# Patient Record
Sex: Male | Born: 1965
Health system: Southern US, Community
[De-identification: ages and names within clinical notes are randomized; demographics above are authoritative.]

## PROBLEM LIST (undated history)

## (undated) DIAGNOSIS — I10 Essential (primary) hypertension: Secondary | ICD-10-CM

## (undated) DIAGNOSIS — D171 Benign lipomatous neoplasm of skin and subcutaneous tissue of trunk: Secondary | ICD-10-CM

## (undated) HISTORY — PX: TONSILECTOMY, ADENOIDECTOMY, BILATERAL MYRINGOTOMY AND TUBES: SHX2538

## (undated) HISTORY — DX: Benign lipomatous neoplasm of skin and subcutaneous tissue of trunk: D17.1

## (undated) HISTORY — DX: Essential (primary) hypertension: I10

---

## 2001-02-03 ENCOUNTER — Encounter: Payer: Self-pay | Admitting: Emergency Medicine

## 2001-02-03 ENCOUNTER — Emergency Department (HOSPITAL_COMMUNITY): Admission: EM | Admit: 2001-02-03 | Discharge: 2001-02-03 | Payer: Self-pay | Admitting: Emergency Medicine

## 2003-08-06 HISTORY — PX: LAPAROSCOPIC CHOLECYSTECTOMY: SUR755

## 2004-06-06 ENCOUNTER — Ambulatory Visit (HOSPITAL_COMMUNITY): Admission: RE | Admit: 2004-06-06 | Discharge: 2004-06-07 | Payer: Self-pay

## 2004-12-28 IMAGING — RF DG CHOLANGIOGRAM OPERATIVE
1 series · 1 of 1 positions shown · non-contrast
Comparison: none

CLINICAL DATA: Biliary dyskinesia.   Laparoscopic cholecystectomy.  
 OPERATIVE CHOLANGIOGRAM:
 A single film of the operative cholangiogram shows good filling of the common bile duct and partial filling of the hepatic radicals. There is no filling defect or obstruction.  The contrast appears to flow freely into the duodenum.

[Series 1: run · 1 of 1 slices shown]
[im 1/1]
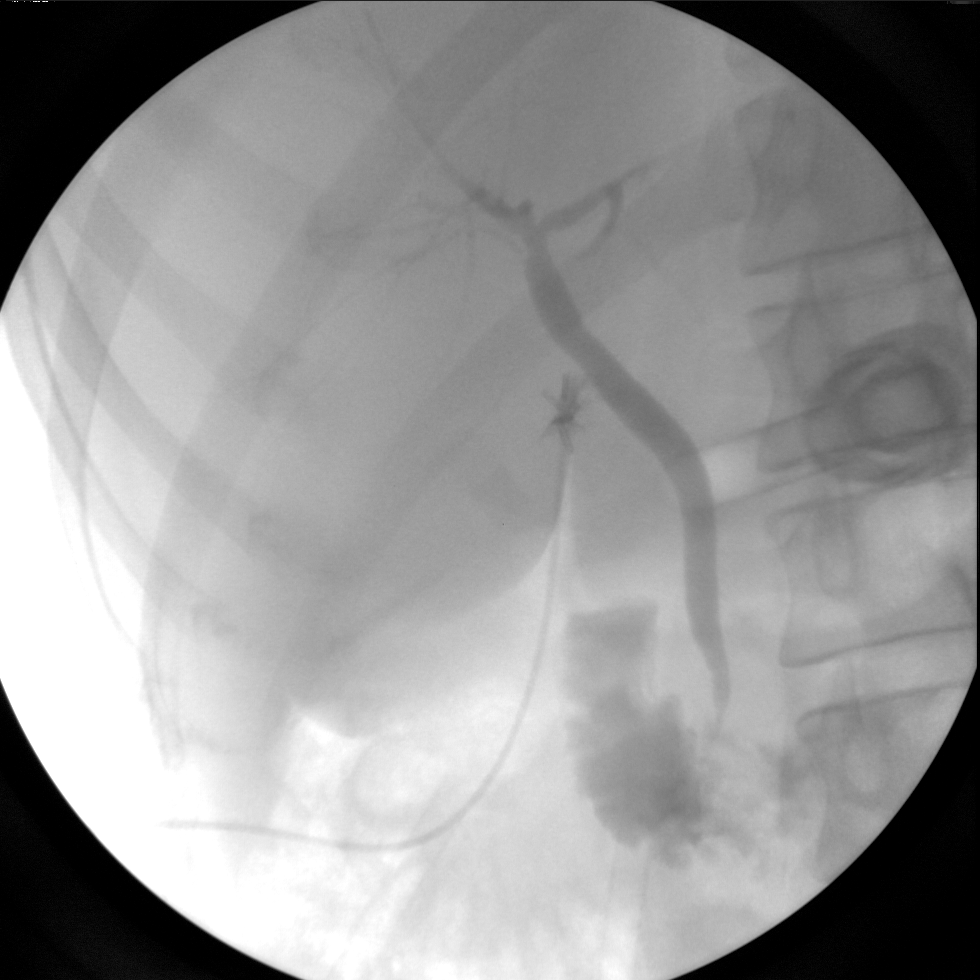

[1 of 1 positions shown; findings below may reference images not displayed]

IMPRESSION: No evidence of obstruction or filling defect of the common bile duct.

## 2006-11-21 ENCOUNTER — Emergency Department (HOSPITAL_COMMUNITY): Admission: EM | Admit: 2006-11-21 | Discharge: 2006-11-21 | Payer: Self-pay | Admitting: Family Medicine

## 2014-08-05 DIAGNOSIS — D171 Benign lipomatous neoplasm of skin and subcutaneous tissue of trunk: Secondary | ICD-10-CM

## 2014-08-05 HISTORY — DX: Benign lipomatous neoplasm of skin and subcutaneous tissue of trunk: D17.1

## 2014-08-30 ENCOUNTER — Other Ambulatory Visit: Payer: Self-pay | Admitting: Family Medicine

## 2014-08-30 ENCOUNTER — Ambulatory Visit (INDEPENDENT_AMBULATORY_CARE_PROVIDER_SITE_OTHER): Payer: BLUE CROSS/BLUE SHIELD | Admitting: Family Medicine

## 2014-08-30 ENCOUNTER — Ambulatory Visit (HOSPITAL_BASED_OUTPATIENT_CLINIC_OR_DEPARTMENT_OTHER)
Admission: RE | Admit: 2014-08-30 | Discharge: 2014-08-30 | Disposition: A | Discharge status: Home / Self Care | Payer: BLUE CROSS/BLUE SHIELD | Source: Ambulatory Visit | Attending: Family Medicine | Admitting: Family Medicine

## 2014-08-30 ENCOUNTER — Encounter: Payer: Self-pay | Admitting: Family Medicine

## 2014-08-30 VITALS — BP 151/106 | HR 84 | Temp 98.1°F | Resp 16 | Ht 64.0 in | Wt 174.0 lb

## 2014-08-30 DIAGNOSIS — R229 Localized swelling, mass and lump, unspecified: Secondary | ICD-10-CM | POA: Insufficient documentation

## 2014-08-30 DIAGNOSIS — I10 Essential (primary) hypertension: Secondary | ICD-10-CM

## 2014-08-30 NOTE — Patient Instructions (Signed)
Buy a blood pressure cuff (upper arm cuff) at any pharmacy and check your blood pressure once daily (make sure you have rested in a quiet area for 10 min prior to checking your blood pressure.  Write numbers down (blood pressure and heart rate) and bring in for review with me in 4-6 weeks.

## 2014-08-30 NOTE — Progress Notes (Signed)
Office Note 08/30/2014  CC:  Chief Complaint  Patient presents with  . Establish Care  . Mass    on back, just recently began to hurt    HPI:  Douglas Ford is a 49 y.o. Hispanic male who is here to establish care and discuss an issue with his back. Patient's most recent primary MD: [facility] [address]. Old records were not reviewed prior to or during today's visit.  Has had a swollen area on low back region, gradually has been bothering him more lately, some pain.  No drainage.  Getting bigger.    Past Medical History  Diagnosis Date  . Hypertension     Past Surgical History  Procedure Laterality Date  . Laparoscopic cholecystectomy  2005  . Tonsilectomy, adenoidectomy, bilateral myringotomy and tubes      Family History  Problem Relation Age of Onset  . Cancer Mother   . Cancer Sister     History   Social History  . Marital Status: Married    Spouse Name: N/A    Number of Children: N/A  . Years of Education: N/A   Occupational History  . Not on file.   Social History Main Topics  . Smoking status: Never Smoker   . Smokeless tobacco: Never Used  . Alcohol Use: No  . Drug Use: No  . Sexual Activity: Not on file   Other Topics Concern  . Not on file   Social History Narrative   Married, no children.   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   No T/A/Ds.    Outpatient Encounter Prescriptions as of 08/30/2014  Medication Sig  . lisinopril-hydrochlorothiazide (PRINZIDE,ZESTORETIC) 20-25 MG per tablet Take 1 tablet by mouth daily.    No Known Allergies  ROS Review of Systems  Constitutional: Negative for fever and fatigue.  HENT: Negative for congestion and sore throat.   Eyes: Negative for visual disturbance.  Respiratory: Negative for cough.   Cardiovascular: Negative for chest pain.  Gastrointestinal: Negative for nausea and abdominal pain.  Genitourinary: Negative for dysuria.  Musculoskeletal: Negative for back pain and joint  swelling.  Skin: Negative for rash.  Neurological: Negative for weakness and headaches.  Hematological: Negative for adenopathy.    PE; Blood pressure 151/106, pulse 84, temperature 98.1 F (36.7 C), temperature source Temporal, resp. rate 16, height 5\' 4"  (1.626 m), weight 174 lb (78.926 kg), SpO2 97 %. Gen: Alert, well appearing.  Patient is oriented to person, place, time, and situation. CHY:IFOY: no injection, icteris, swelling, or exudate.  EOMI, PERRLA. Mouth: lips without lesion/swelling.  Oral mucosa pink and moist. Oropharynx without erythema, exudate, or swelling.  Neck - No masses or thyromegaly or limitation in range of motion CV: RRR, no m/r/g.   LUNGS: CTA bilat, nonlabored resps, good aeration in all lung fields. EXT: no clubbing, cyanosis, or edema.  BACK: 6 cm diameter oval subQ mass that is somewhat fluctuant but firm.  No warmth, erythema, or tenderness.  No drainage tract.    Pertinent labs:  None today  ASSESSMENT AND PLAN:   New pt; obtain old records.  Subcutaneous mass Would like to get u/s and get further clarification of what this is. If lipoma or neuroma, will leave alone and monitor/manage expectantly. If cystic, then patient would like this excised/drained.   HTN (hypertension), benign Buy a blood pressure cuff (upper arm cuff) at any pharmacy and check your blood pressure once daily (make sure you have rested in a quiet area  for 10 min prior to checking your blood pressure.  Write numbers down (blood pressure and heart rate) and bring in for review with me in 4-6 weeks.     An After Visit Summary was printed and given to the patient.  Return for 4-6 wk f/u HTN.

## 2014-08-30 NOTE — Progress Notes (Signed)
Pre visit review using our clinic review tool, if applicable. No additional management support is needed unless otherwise documented below in the visit note. 

## 2014-08-30 NOTE — Assessment & Plan Note (Signed)
Would like to get u/s and get further clarification of what this is. If lipoma or neuroma, will leave alone and monitor/manage expectantly. If cystic, then patient would like this excised/drained.

## 2014-08-30 NOTE — Assessment & Plan Note (Signed)
Buy a blood pressure cuff (upper arm cuff) at any pharmacy and check your blood pressure once daily (make sure you have rested in a quiet area for 10 min prior to checking your blood pressure.  Write numbers down (blood pressure and heart rate) and bring in for review with me in 4-6 weeks.

## 2014-08-31 ENCOUNTER — Encounter: Payer: Self-pay | Admitting: Family Medicine

## 2014-10-05 ENCOUNTER — Encounter: Payer: Self-pay | Admitting: Family Medicine

## 2014-10-05 ENCOUNTER — Ambulatory Visit (INDEPENDENT_AMBULATORY_CARE_PROVIDER_SITE_OTHER): Payer: BLUE CROSS/BLUE SHIELD | Admitting: Family Medicine

## 2014-10-05 VITALS — BP 152/98 | HR 71 | Temp 97.6°F | Ht 64.0 in | Wt 177.0 lb

## 2014-10-05 DIAGNOSIS — I10 Essential (primary) hypertension: Secondary | ICD-10-CM

## 2014-10-05 LAB — COMPREHENSIVE METABOLIC PANEL
ALBUMIN: 4 g/dL (ref 3.5–5.2)
ALK PHOS: 43 U/L (ref 39–117)
ALT: 36 U/L (ref 0–53)
AST: 19 U/L (ref 0–37)
BUN: 19 mg/dL (ref 6–23)
CALCIUM: 8.7 mg/dL (ref 8.4–10.5)
CHLORIDE: 107 meq/L (ref 96–112)
CO2: 28 mEq/L (ref 19–32)
CREATININE: 0.9 mg/dL (ref 0.40–1.50)
GFR: 95.22 mL/min (ref >60.00)
Glucose, Bld: 107 mg/dL — ABNORMAL HIGH (ref 70–99)
Potassium: 3.7 mEq/L (ref 3.5–5.1)
Sodium: 141 mEq/L (ref 135–145)
Total Bilirubin: 0.6 mg/dL (ref 0.2–1.2)
Total Protein: 6.5 g/dL (ref 6.0–8.3)

## 2014-10-05 NOTE — Patient Instructions (Signed)
Check bp and heart rate daily for 2 weeks and call with report of these numbers to my nurse.  Review DASH diet handout.

## 2014-10-05 NOTE — Progress Notes (Signed)
OFFICE NOTE  10/05/2014  CC:  Chief Complaint  Patient presents with  . Follow-up   HPI: Patient is a 49 y.o. Hispanic male who is here for f/u HTN.   Bought bp cuff but didn't get batteries so no checks yet. No persistent HA's, no dizziness, no vision probs. Compliant with bp med but not low sod diet.  Pertinent PMH:  Past medical, surgical, social, and family history reviewed and no changes are noted since last office visit.  MEDS:  Outpatient Prescriptions Prior to Visit  Medication Sig Dispense Refill  . lisinopril-hydrochlorothiazide (PRINZIDE,ZESTORETIC) 20-25 MG per tablet Take 1 tablet by mouth daily.     No facility-administered medications prior to visit.    PE: Blood pressure 152/98, pulse 71, temperature 97.6 F (36.4 C), temperature source Temporal, height 5\' 4"  (1.626 m), weight 177 lb (80.287 kg), SpO2 97 %. Gen: Alert, well appearing.  Patient is oriented to person, place, time, and situation. AFFECT: pleasant, lucid thought and speech. No further exam today.  IMPRESSION AND PLAN:  1) HTN; elevated here this visit and last visit but we need home bp monitoring date. Instructions: Check bp and heart rate daily for 2 weeks and call with report of these numbers to my nurse. CMET today (he is not fasting). Review DASH diet handout.   FOLLOW UP: To be determined based on results of pending bp values.

## 2014-10-05 NOTE — Progress Notes (Signed)
Pre visit review using our clinic review tool, if applicable. No additional management support is needed unless otherwise documented below in the visit note. 

## 2015-09-04 ENCOUNTER — Telehealth: Payer: Self-pay | Admitting: Family Medicine

## 2015-09-04 MED ORDER — LISINOPRIL-HYDROCHLOROTHIAZIDE 20-25 MG PO TABS: 1.0000 | ORAL_TABLET | Freq: Every day | ORAL | Status: DC

## 2015-09-04 NOTE — Telephone Encounter (Signed)
RF request for lisinopril/hctz LOV: 10/05/14 Next ov: None Last written: unknown  Rx sent for #30 w/ 3Rf. Pt needs ov for more refills.

## 2015-09-04 NOTE — Telephone Encounter (Signed)
Left message for Douglas Ford to call back.  

## 2015-09-04 NOTE — Telephone Encounter (Signed)
Douglas Ford and voiced understanding, okay per DPR.

## 2015-09-04 NOTE — Telephone Encounter (Signed)
Lisinopril [facility]

## 2015-09-21 ENCOUNTER — Encounter: Payer: Self-pay | Admitting: Family Medicine

## 2015-09-21 ENCOUNTER — Ambulatory Visit (INDEPENDENT_AMBULATORY_CARE_PROVIDER_SITE_OTHER): Payer: 59 | Admitting: Family Medicine

## 2015-09-21 VITALS — BP 124/84 | HR 68 | Temp 97.7°F | Resp 16 | Ht 64.0 in | Wt 180.0 lb

## 2015-09-21 DIAGNOSIS — K219 Gastro-esophageal reflux disease without esophagitis: Secondary | ICD-10-CM

## 2015-09-21 DIAGNOSIS — D179 Benign lipomatous neoplasm, unspecified: Secondary | ICD-10-CM | POA: Diagnosis not present

## 2015-09-21 DIAGNOSIS — I10 Essential (primary) hypertension: Secondary | ICD-10-CM

## 2015-09-21 DIAGNOSIS — F4541 Pain disorder exclusively related to psychological factors: Secondary | ICD-10-CM | POA: Diagnosis not present

## 2015-09-21 LAB — BASIC METABOLIC PANEL
BUN: 15 mg/dL (ref 6–23)
CO2: 31 mEq/L (ref 19–32)
Calcium: 9.1 mg/dL (ref 8.4–10.5)
Chloride: 104 mEq/L (ref 96–112)
Creatinine, Ser: 0.91 mg/dL (ref 0.40–1.50)
GFR: 93.65 mL/min (ref >60.00)
Glucose, Bld: 94 mg/dL (ref 70–99)
Potassium: 3.7 mEq/L (ref 3.5–5.1)
SODIUM: 141 meq/L (ref 135–145)

## 2015-09-21 MED ORDER — AMLODIPINE BESYLATE 5 MG PO TABS: 5.0000 mg | ORAL_TABLET | Freq: Every day | ORAL | Status: DC

## 2015-09-21 NOTE — Progress Notes (Signed)
OFFICE VISIT  09/21/2015   CC:  Chief Complaint  Patient presents with  . Heartburn    x 2 weeks off and on  . Hypertension     HPI:    Patient is a 50 y.o. Hispanic male who presents for f/u HTN--I last saw him about 11 mo ago.  Also wants to discuss GER sx's. Checks bp "randomly".   Avg syst per his recollection is >140 but close to normal.  Diastolic numbers not recalled as well but they think they are a bit high.  He takes his med every day.  No changes in diet or exercise since I saw him last.  Still having some HA's.  He is not able to be clear on the frequency.  Throbbing HA, gets better with advil or tylenol. Occ nausea.   He feels frequent pain in back at the site of his lipoma, feels like it is getting a bit bigger.  Last couple of weeks he feels more frequent substernal burning after eating.  Tums helps a little.    ROS: no n/v, no melena/hematochezia, no dizziness, no CP or SOB, no vision complaints, no focal weakness.  Past Medical History  Diagnosis Date  . Hypertension   . Lipoma of back 08/2014    ultrasound-confirmed    Past Surgical History  Procedure Laterality Date  . Laparoscopic cholecystectomy  2005  . Tonsilectomy, adenoidectomy, bilateral myringotomy and tubes      Outpatient Prescriptions Prior to Visit  Medication Sig Dispense Refill  . lisinopril-hydrochlorothiazide (PRINZIDE,ZESTORETIC) 20-25 MG tablet Take 1 tablet by mouth daily. 30 tablet 3   No facility-administered medications prior to visit.    No Known Allergies  ROS As per HPI  PE: Blood pressure 124/84, pulse 68, temperature 97.7 F (36.5 C), temperature source Oral, resp. rate 16, height 5\' 4"  (1.626 m), weight 180 lb (81.647 kg), SpO2 95 %. Gen: Alert, well appearing.  Patient is oriented to person, place, time, and situation. CV: RRR, no m/r/g.   LUNGS: CTA bilat, nonlabored resps, good aeration in all lung fields. BACK: R LB directly overlying paraspinous muscles is a  fairly soft subQ mass 3 inches in diamter L to R and 1.5 inches top to bottom.  This has previously been imaged and is a lipoma.  LABS:  None today  IMPRESSION AND PLAN:  1) HTN; control pretty good but not ideal.  Home monitoring needs to be better/clearer. Check blood pressure and heart rate 1-2 times per week and write the numbers down for review with me at next follow up visit in 3 months. Add amlodipine 5mg  qd. Check BMET today.  2) GERD: Buy generic otc zantac 150mg  tabs and take one as needed up to twice a day for heartburn. GERD diet handout given to patient.  3) Tension/stress headaches.  Possible occ migrainous type HA.  Not related to HTN. REassured, recommended he continue prn management with advil or tylenol since these are not that frequent and this med helps.  4) Lipoma: he insists that this is causing him pain when he works.  I told him I would refer him to gen surgeon for consideration of excision if he wants---he wants to think about it for now.  An After Visit Summary was printed and given to the patient  FOLLOW UP: Return in about 3 months (around 12/19/2015) for annual CPE (fasting).

## 2015-09-21 NOTE — Progress Notes (Signed)
Pre visit review using our clinic review tool, if applicable. No additional management support is needed unless otherwise documented below in the visit note. 

## 2015-09-21 NOTE — Patient Instructions (Addendum)
Buy generic otc zantac 150mg  tabs and take one as needed up to twice a day for heartburn.  Check blood pressure and heart rate 1-2 times per week and write the numbers down for review with me at next follow up visit in 3 months.

## 2015-09-22 ENCOUNTER — Encounter: Payer: Self-pay | Admitting: Family Medicine

## 2015-12-11 ENCOUNTER — Telehealth: Payer: Self-pay | Admitting: Family Medicine

## 2015-12-11 NOTE — Telephone Encounter (Signed)
The fatty tumor on patient's back is starting to hurt. He is wondering if he should have it removed. If yes, who does Dr. Anitra Lauth recommend?

## 2015-12-12 ENCOUNTER — Other Ambulatory Visit: Payer: Self-pay | Admitting: Family Medicine

## 2015-12-12 DIAGNOSIS — D171 Benign lipomatous neoplasm of skin and subcutaneous tissue of trunk: Secondary | ICD-10-CM

## 2015-12-12 NOTE — Telephone Encounter (Signed)
I'll make referral to [facility] in [place].  Any of the surgeons there are good.

## 2015-12-13 NOTE — Telephone Encounter (Signed)
Spoke to spouse. Gave info per Dr. Idelle Leech previous note. Spouse verbalized understanding.

## 2015-12-25 ENCOUNTER — Ambulatory Visit: Payer: Self-pay | Admitting: Surgery

## 2015-12-26 ENCOUNTER — Encounter: Payer: 59 | Admitting: Family Medicine

## 2015-12-26 DIAGNOSIS — Z0289 Encounter for other administrative examinations: Secondary | ICD-10-CM

## 2016-01-31 ENCOUNTER — Other Ambulatory Visit: Payer: Self-pay | Admitting: Family Medicine

## 2016-02-01 ENCOUNTER — Other Ambulatory Visit: Payer: Self-pay | Admitting: *Deleted

## 2016-02-01 NOTE — Telephone Encounter (Signed)
RF request for lisinopril/hctz LOV: 09/21/15 Next ov: None Last written: 09/04/15 #30 w/ 3RF

## 2016-04-10 ENCOUNTER — Other Ambulatory Visit: Payer: Self-pay | Admitting: Family Medicine

## 2016-06-17 ENCOUNTER — Other Ambulatory Visit: Payer: Self-pay | Admitting: Family Medicine

## 2016-06-18 NOTE — Telephone Encounter (Signed)
Tried to contact patient,  Pt missed his last CPE.   He needs an appointment for further refills.

## 2016-07-10 ENCOUNTER — Other Ambulatory Visit: Payer: Self-pay | Admitting: Family Medicine

## 2016-07-11 ENCOUNTER — Encounter: Payer: Self-pay | Admitting: *Deleted

## 2016-07-11 NOTE — Telephone Encounter (Signed)
RF request for amlodipine LOV: 09/11/15 Next ov: None Last written: 04/11/16 #30 w/ 0RF  RF request for lisinopril/hctz Last written: 02/01/16 #30 w/ 3RF  Rx's sent for #30 w/ [id number]. Pt is over due for office visit.   Tried to call pt NA and unable to leave a message. Letter sent to address in EMR.

## 2016-08-26 ENCOUNTER — Telehealth: Payer: Self-pay | Admitting: Family Medicine

## 2016-08-26 NOTE — Telephone Encounter (Signed)
Pt's spouse contacted our office stating they just moved to [place] from [facility] and they would like to establish care at [facility] with Dr. Yong Channel.  Is this okay to schedule?

## 2016-08-26 NOTE — Telephone Encounter (Signed)
OK with me.

## 2016-09-03 ENCOUNTER — Encounter: Payer: Self-pay | Admitting: Gastroenterology

## 2016-09-03 ENCOUNTER — Ambulatory Visit (INDEPENDENT_AMBULATORY_CARE_PROVIDER_SITE_OTHER): Payer: 59 | Admitting: Family Medicine

## 2016-09-03 ENCOUNTER — Encounter: Payer: Self-pay | Admitting: Family Medicine

## 2016-09-03 VITALS — BP 138/90 | HR 73 | Temp 98.4°F | Ht 64.5 in | Wt 177.4 lb

## 2016-09-03 DIAGNOSIS — Z1211 Encounter for screening for malignant neoplasm of colon: Secondary | ICD-10-CM

## 2016-09-03 DIAGNOSIS — I10 Essential (primary) hypertension: Secondary | ICD-10-CM | POA: Diagnosis not present

## 2016-09-03 DIAGNOSIS — R51 Headache: Secondary | ICD-10-CM

## 2016-09-03 DIAGNOSIS — R519 Headache, unspecified: Secondary | ICD-10-CM | POA: Insufficient documentation

## 2016-09-03 DIAGNOSIS — R229 Localized swelling, mass and lump, unspecified: Secondary | ICD-10-CM

## 2016-09-03 DIAGNOSIS — K219 Gastro-esophageal reflux disease without esophagitis: Secondary | ICD-10-CM | POA: Diagnosis not present

## 2016-09-03 DIAGNOSIS — Z23 Encounter for immunization: Secondary | ICD-10-CM

## 2016-09-03 MED ORDER — AMLODIPINE BESYLATE 5 MG PO TABS: 5.0000 mg | ORAL_TABLET | Freq: Every day | ORAL | 3 refills | Status: DC

## 2016-09-03 MED ORDER — LISINOPRIL-HYDROCHLOROTHIAZIDE 20-25 MG PO TABS: 1.0000 | ORAL_TABLET | Freq: Every day | ORAL | 3 refills | Status: DC

## 2016-09-03 MED ORDER — DICLOFENAC SODIUM 1 % TD GEL: 2.0000 g | Freq: Four times a day (QID) | TRANSDERMAL | 1 refills | Status: DC

## 2016-09-03 NOTE — Patient Instructions (Addendum)
Mychart?  Tdap today.   We will call you within a week or two about your referral to GI for colonoscopy. If you do not hear within 3 weeks, give [place] a call.   6 months for physical. Schedule labs a week before- ask them specifically for HIV test to be added on under screen for HIV.   Try voltaren gel for the pain over lipoma. If this is too expensive- could try something like aspercreme or icy hot. If it becomes a progressive issues, should return to Dr. Brantley Stage to discuss surgery again  BP Readings from Last 3 Encounters:  09/03/16 138/90  09/21/15 124/84  10/05/14 (!) 152/98  Blood pressure slightly high- 10 lbs weight loss, regular exercise can likely get this back in range without more medicine. Refilled both medicines today for blood pressure

## 2016-09-03 NOTE — Assessment & Plan Note (Signed)
S: mild poorly controlled on Lisinopril hct 20-25 mg, amlodipine 5mg . But took just 30 minutes before visit BP Readings from Last 3 Encounters:  09/03/16 138/90  09/21/15 124/84  10/05/14 (!) 152/98  A/P:Continue current meds:  We decided to focus on weight loss with goal 10 lbs off in the next 6 months then return for physical

## 2016-09-03 NOTE — Progress Notes (Signed)
Phone: [phone]  Subjective:  Patient presents today to establish care with me as their new primary care provider. Patient was formerly a patient of Dr. Anitra Lauth but recently moved to [place]. Chief complaint-noted.   See problem oriented charting ROS- some left low back pain, right knee pain at times (normal exam-? Oa). No chest pain or shortness of breath with stairs  The following were reviewed and entered/updated in epic: Past Medical History:  Diagnosis Date  . Hypertension   . Lipoma of back 08/2014   ultrasound-confirmed   Patient Active Problem List   Diagnosis Date Noted  . GERD (gastroesophageal reflux disease) 09/03/2016    Priority: Medium  . HTN (hypertension), benign 08/30/2014    Priority: Medium  . Headache 09/03/2016    Priority: Low  . Subcutaneous mass 08/30/2014    Priority: Low   Past Surgical History:  Procedure Laterality Date  . LAPAROSCOPIC CHOLECYSTECTOMY  2005  . TONSILECTOMY, ADENOIDECTOMY, BILATERAL MYRINGOTOMY AND TUBES      Family History  Problem Relation Age of Onset  . Breast cancer Mother     mets  . Other Father     poor communication  . Breast cancer Sister     Medications- reviewed and updated Current Outpatient Prescriptions  Medication Sig Dispense Refill  . amLODipine (NORVASC) 5 MG tablet Take 1 tablet (5 mg total) by mouth daily. 90 tablet 3  . lisinopril-hydrochlorothiazide (PRINZIDE,ZESTORETIC) 20-25 MG tablet Take 1 tablet by mouth daily. 90 tablet 3  . diclofenac sodium (VOLTAREN) 1 % GEL Apply 2 g topically 4 (four) times daily. 100 g 1   No current facility-administered medications for this visit.     Allergies-reviewed and updated No Known Allergies  Social History   Social History  . Marital status: Married    Spouse name: N/A  . Number of children: N/A  . Years of education: N/A   Social History Main Topics  . Smoking status: Never Smoker  . Smokeless tobacco: Never Used  . Alcohol use Yes   Comment: social  . Drug use: No  . Sexual activity: Not Asked   Other Topics Concern  . None   Social History Narrative   Married (same sex marriage) 07/17/13 , no children.      Ed: HS   Orig from [place].  Has lived in [place] since about 2000.   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       Hobbies: watching tv, resting.     Objective: BP 138/90   Pulse 73   Temp 98.4 F (36.9 C) (Oral)   Ht 5' 4.5" (1.638 m)   Wt 177 lb 6.4 oz (80.5 kg)   SpO2 97%   BMI 29.98 kg/m  Gen: NAD, resting comfortably HEENT: Mucous membranes are moist. Oropharynx normal CV: RRR no murmurs rubs or gallops Lungs: CTAB no crackles, wheeze, rhonchi Abdomen: soft/nontender/nondistended/normal bowel sounds.overweight Ext: no edema Skin: warm, dry, see lipoma notes below Neuro: grossly normal, moves all extremities, PERRLA  Assessment/Plan:  HTN (hypertension), benign S: mild poorly controlled on Lisinopril hct 20-25 mg, amlodipine 5mg . But took just 30 minutes before visit BP Readings from Last 3 Encounters:  09/03/16 138/90  09/21/15 124/84  10/05/14 (!) 152/98  A/P:Continue current meds:  We decided to focus on weight loss with goal 10 lbs off in the next 6 months then return for physical  GERD (gastroesophageal reflux disease) S: last year was taking Tums. Dr. Anitra Lauth recommended trial of zantac 150mg  BID as  was nto always controlling symptoms. Patient prefers prn tums so has stuck with tthis and helps- a few times a week A/P: discussed role of weight loss in helping with reflux.   Subcutaneous mass S: round bump on his back that is sore. When stands up straight starts to bother him.determined to be a lipoma in past. Saw surgeon and preferred not to have surgery at the time.  O: 5 x 5 cm lipoma left low back to left of spine A/P: trial voltaren gel- avoid oral nsaids if able with HTN mild poor control. Discussed if expensive could do aspercreme or icy hot. Tylenol also an option.  May end up needing surgery  6 months cpe with labs a week before and also HIV testing advised  Orders Placed This Encounter  Procedures  . Tdap vaccine greater than or equal to 7yo IM  . Ambulatory referral to Gastroenterology    Referral Priority:   Routine    Referral Type:   Consultation    Referral Reason:   Specialty Services Required    Number of Visits Requested:   1    Meds ordered this encounter  Medications  . amLODipine (NORVASC) 5 MG tablet    Sig: Take 1 tablet (5 mg total) by mouth daily.    Dispense:  90 tablet    Refill:  3  . lisinopril-hydrochlorothiazide (PRINZIDE,ZESTORETIC) 20-25 MG tablet    Sig: Take 1 tablet by mouth daily.    Dispense:  90 tablet    Refill:  3  . diclofenac sodium (VOLTAREN) 1 % GEL    Sig: Apply 2 g topically 4 (four) times daily.    Dispense:  100 g    Refill:  1   The duration of face-to-face time during this visit was greater than 30 minutes. Greater than 50% of this time was spent in counseling primarily on diet/exercise and relation to his disease processes  Return precautions advised.   Garret Reddish, MD

## 2016-09-03 NOTE — Assessment & Plan Note (Signed)
S: last year was taking Tums. Dr. Anitra Lauth recommended trial of zantac 150mg  BID as was nto always controlling symptoms. Patient prefers prn tums so has stuck with tthis and helps- a few times a week A/P: discussed role of weight loss in helping with reflux.

## 2016-09-03 NOTE — Progress Notes (Signed)
Pre visit review using our clinic review tool, if applicable. No additional management support is needed unless otherwise documented below in the visit note. 

## 2016-09-03 NOTE — Assessment & Plan Note (Signed)
S: round bump on his back that is sore. When stands up straight starts to bother him.determined to be a lipoma in past. Saw surgeon and preferred not to have surgery at the time.  O: 5 x 5 cm lipoma left low back to left of spine A/P: trial voltaren gel- avoid oral nsaids if able with HTN mild poor control. Discussed if expensive could do aspercreme or icy hot. Tylenol also an option. May end up needing surgery

## 2016-10-02 ENCOUNTER — Ambulatory Visit (AMBULATORY_SURGERY_CENTER): Payer: Self-pay | Admitting: *Deleted

## 2016-10-02 VITALS — Ht 65.0 in | Wt 176.0 lb

## 2016-10-02 DIAGNOSIS — Z1211 Encounter for screening for malignant neoplasm of colon: Secondary | ICD-10-CM

## 2016-10-02 MED ORDER — NA SULFATE-K SULFATE-MG SULF 17.5-3.13-1.6 GM/177ML PO SOLN: ORAL | 0 refills | Status: DC

## 2016-10-02 NOTE — Progress Notes (Signed)
Patient denies any allergies to eggs or soy. Patient denies any problems with anesthesia/sedation. Patient denies any oxygen use at home and does not take any diet/weight loss medications. EMMI education assisgned to patient on colonoscopy, this was explained and instructions given to patient. 

## 2016-10-04 ENCOUNTER — Encounter: Payer: Self-pay | Admitting: Gastroenterology

## 2016-10-15 ENCOUNTER — Ambulatory Visit (AMBULATORY_SURGERY_CENTER): Payer: 59 | Admitting: Gastroenterology

## 2016-10-15 ENCOUNTER — Encounter: Payer: Self-pay | Admitting: Gastroenterology

## 2016-10-15 VITALS — BP 130/89 | HR 62 | Temp 98.4°F | Resp 20 | Ht 65.0 in | Wt 176.0 lb

## 2016-10-15 DIAGNOSIS — Z1211 Encounter for screening for malignant neoplasm of colon: Secondary | ICD-10-CM

## 2016-10-15 DIAGNOSIS — Z1212 Encounter for screening for malignant neoplasm of rectum: Secondary | ICD-10-CM

## 2016-10-15 MED ORDER — SODIUM CHLORIDE 0.9 % IV SOLN: 500.0000 mL | INTRAVENOUS | Status: DC

## 2016-10-15 NOTE — Op Note (Signed)
[facility] Patient Name: Douglas Ford Procedure Date: 10/15/2016 8:56 AM MRN: [id number] Endoscopist: Mauri Pole , MD Age: 51 Referring MD:  Date of Birth: 08-Mar-1966 Gender: Male Account #: [id number] Procedure:                Colonoscopy Indications:              Screening for colorectal malignant neoplasm, This                            is the patient's first colonoscopy Medicines:                Monitored Anesthesia Care Procedure:                Pre-Anesthesia Assessment:                           - Prior to the procedure, a History and Physical                            was performed, and patient medications and                            allergies were reviewed. The patient's tolerance of                            previous anesthesia was also reviewed. The risks                            and benefits of the procedure and the sedation                            options and risks were discussed with the patient.                            All questions were answered, and informed consent                            was obtained. Prior Anticoagulants: The patient has                            taken no previous anticoagulant or antiplatelet                            agents. ASA Grade Assessment: II - A patient with                            mild systemic disease. After reviewing the risks                            and benefits, the patient was deemed in                            satisfactory condition to undergo the procedure.  After obtaining informed consent, the colonoscope                            was passed under direct vision. Throughout the                            procedure, the patient's blood pressure, pulse, and                            oxygen saturations were monitored continuously. The                            Colonoscope was introduced through the anus and                            advanced to the the  cecum, identified by                            appendiceal orifice and ileocecal valve. The                            colonoscopy was performed without difficulty. The                            patient tolerated the procedure well. The quality                            of the bowel preparation was excellent. The                            terminal ileum, ileocecal valve, appendiceal                            orifice, and rectum were photographed. Scope In: 9:18:23 AM Scope Out: 9:35:20 AM Scope Withdrawal Time: 0 hours 7 minutes 18 seconds  Total Procedure Duration: 0 hours 16 minutes 57 seconds  Findings:                 The perianal and digital rectal examinations were                            normal.                           A few small-mouthed diverticula were found in the                            sigmoid colon.                           The exam was otherwise without abnormality on                            direct and retroflexion views. Complications:            No immediate complications. Estimated Blood Loss:  Estimated blood loss: none. Impression:               - Diverticulosis in the sigmoid colon.                           - The examination was otherwise normal on direct                            and retroflexion views.                           - No specimens collected. Recommendation:           - Patient has a contact number available for                            emergencies. The signs and symptoms of potential                            delayed complications were discussed with the                            patient. Return to normal activities tomorrow.                            Written discharge instructions were provided to the                            patient.                           - Resume previous diet.                           - Continue present medications.                           - Repeat colonoscopy in 10 years for screening                             purposes. Mauri Pole, MD 10/15/2016 9:39:34 AM This report has been signed electronically.

## 2016-10-15 NOTE — Progress Notes (Signed)
Report to PACU, RN, vss, BBS= Clear.  

## 2016-10-15 NOTE — Progress Notes (Signed)
Pt. Passing gas, abdomen less distended.  States he Feels better.

## 2016-10-15 NOTE — Patient Instructions (Signed)
YOU HAD AN ENDOSCOPIC PROCEDURE TODAY AT THE [facility]:   Refer to the procedure report that was given to you for any specific questions about what was found during the examination.  If the procedure report does not answer your questions, please call your gastroenterologist to clarify.  If you requested that your care partner not be given the details of your procedure findings, then the procedure report has been included in a sealed envelope for you to review at your convenience later.  YOU SHOULD EXPECT: Some feelings of bloating in the abdomen. Passage of more gas than usual.  Walking can help get rid of the air that was put into your GI tract during the procedure and reduce the bloating. If you had a lower endoscopy (such as a colonoscopy or flexible sigmoidoscopy) you may notice spotting of blood in your stool or on the toilet paper. If you underwent a bowel prep for your procedure, you may not have a normal bowel movement for a few days.  Please Note:  You might notice some irritation and congestion in your nose or some drainage.  This is from the oxygen used during your procedure.  There is no need for concern and it should clear up in a day or so.  SYMPTOMS TO REPORT IMMEDIATELY:   Following lower endoscopy (colonoscopy or flexible sigmoidoscopy):  Excessive amounts of blood in the stool  Significant tenderness or worsening of abdominal pains  Swelling of the abdomen that is new, acute  Fever of 100F or higher   For urgent or emergent issues, a gastroenterologist can be reached at any hour by calling [phone].   DIET:  We do recommend a small meal at first, but then you may proceed to your regular diet.  Drink plenty of fluids but you should avoid alcoholic beverages for 24 hours.  ACTIVITY:  You should plan to take it easy for the rest of today and you should NOT DRIVE or use heavy machinery until tomorrow (because of the sedation medicines used during the test).     FOLLOW UP: Our staff will call the number listed on your records the next business day following your procedure to check on you and address any questions or concerns that you may have regarding the information given to you following your procedure. If we do not reach you, we will leave a message.  However, if you are feeling well and you are not experiencing any problems, there is no need to return our call.  We will assume that you have returned to your regular daily activities without incident.  If any biopsies were taken you will be contacted by phone or by letter within the next 1-3 weeks.  Please call us at [phone] if you have not heard about the biopsies in 3 weeks.    SIGNATURES/CONFIDENTIALITY: You and/or your care partner have signed paperwork which will be entered into your electronic medical record.  These signatures attest to the fact that that the information above on your After Visit Summary has been reviewed and is understood.  Full responsibility of the confidentiality of this discharge information lies with you and/or your care-partner.  Diverticulosis and high fiber diet information given.  Recall 10 years-2028 

## 2016-10-15 NOTE — Progress Notes (Signed)
Pt distended and has difficulty passing gas.  Gave Simethecone 0.58ml =40mg . For discomfort.

## 2016-10-16 ENCOUNTER — Telehealth: Payer: Self-pay | Admitting: *Deleted

## 2016-10-16 NOTE — Telephone Encounter (Signed)
  Follow up Call-  Call back number 10/15/2016  Post procedure Call Back phone  # [phone]  Permission to leave phone message Yes  Some recent data might be hidden     Patient questions:  Do you have a fever, pain , or abdominal swelling? No. Pain Score  0 *  Have you tolerated food without any problems? Yes.    Have you been able to return to your normal activities? Yes.    Do you have any questions about your discharge instructions: Diet   No. Medications  No. Follow up visit  No.  Do you have questions or concerns about your Care? No.  Actions: * If pain score is 4 or above: No action needed, pain <4.

## 2016-10-17 ENCOUNTER — Ambulatory Visit (INDEPENDENT_AMBULATORY_CARE_PROVIDER_SITE_OTHER): Payer: 59 | Admitting: Podiatry

## 2016-10-17 ENCOUNTER — Encounter: Payer: Self-pay | Admitting: Podiatry

## 2016-10-17 DIAGNOSIS — L600 Ingrowing nail: Secondary | ICD-10-CM

## 2016-10-17 NOTE — Patient Instructions (Signed)

## 2016-10-18 NOTE — Progress Notes (Signed)
Subjective:     Patient ID: Douglas Ford, male   DOB: 1966-02-21, 51 y.o.   MRN: [id number]  HPI patient presents with chronic ingrown toenail deformity of both big toes trading he tries to cut them out himself but it's becoming increasingly hard. States the entire right nail is damaged and the left the corners get sore   Review of Systems  All other systems reviewed and are negative.      Objective:   Physical Exam  Constitutional: He is oriented to person, place, and time.  Cardiovascular: Intact distal pulses.   Musculoskeletal: Normal range of motion.  Neurological: He is oriented to person, place, and time.  Skin: Skin is warm.  Nursing note and vitals reviewed.  neurovascular status intact muscle strength adequate range of motion within normal limits with patient found to have incurvated left hallux medial lateral borders and thickened damage right hallux nailbed. Patient shows multiple signs of trying to fix them himself and does have flatfoot deformity also noted     Assessment:     Chronic nail disease hallux bilateral with chronic ingrown toenail deformity and damaged right hallux    Plan:     H&P and conditions reviewed. Today I went ahead and discussed surgical intervention and patient wants surgery understanding risk and I infiltrated each big toe 60 mg like Marcaine mixture and under sterile conditions removed the right hallux nail exposing the matrix and applying phenol 5 applications 30 seconds followed by alcohol lavaged and for the left I removed the medial lateral border exposed the matrix and applied phenol 3 applications 30 seconds to each followed by alcohol lavage and sterile dressing. Gave instructions on soaks and reappoint

## 2016-12-30 ENCOUNTER — Ambulatory Visit (HOSPITAL_COMMUNITY)
Admission: EM | Admit: 2016-12-30 | Discharge: 2016-12-30 | Disposition: A | Discharge status: Home / Self Care | Payer: BLUE CROSS/BLUE SHIELD | Attending: Family Medicine | Admitting: Family Medicine

## 2016-12-30 ENCOUNTER — Encounter (HOSPITAL_COMMUNITY): Payer: Self-pay | Admitting: Emergency Medicine

## 2016-12-30 DIAGNOSIS — R300 Dysuria: Secondary | ICD-10-CM | POA: Insufficient documentation

## 2016-12-30 DIAGNOSIS — N12 Tubulo-interstitial nephritis, not specified as acute or chronic: Secondary | ICD-10-CM

## 2016-12-30 DIAGNOSIS — I10 Essential (primary) hypertension: Secondary | ICD-10-CM | POA: Insufficient documentation

## 2016-12-30 DIAGNOSIS — R350 Frequency of micturition: Secondary | ICD-10-CM | POA: Insufficient documentation

## 2016-12-30 LAB — POCT URINALYSIS DIP (DEVICE)
BILIRUBIN URINE: NEGATIVE
Glucose, UA: 100 mg/dL — AB
Ketones, ur: NEGATIVE mg/dL
NITRITE: POSITIVE — AB
Protein, ur: 30 mg/dL — AB
Specific Gravity, Urine: 1.02 (ref 1.005–1.030)
Urobilinogen, UA: 1 mg/dL (ref 0.0–1.0)
pH: 7 (ref 5.0–8.0)

## 2016-12-30 LAB — GLUCOSE, CAPILLARY: Glucose-Capillary: 145 mg/dL — ABNORMAL HIGH (ref 65–99)

## 2016-12-30 MED ORDER — CIPROFLOXACIN HCL 500 MG PO TABS: 500.0000 mg | ORAL_TABLET | Freq: Two times a day (BID) | ORAL | 0 refills | Status: AC

## 2016-12-30 NOTE — ED Provider Notes (Signed)
CSN: [id number]     Arrival date & time 12/30/16  1011 History   First MD Initiated Contact with Patient 12/30/16 1112     Chief Complaint  Patient presents with  . Urinary Frequency  . Dysuria   (Consider location/radiation/quality/duration/timing/severity/associated sxs/prior Treatment) Patient is a 51 y.o. Male, with hx of HTN, here for dysuria, urinary frequency, feeling like he needs to void but can't go accompany by fever of max 100.4 at home, nausea and bilateral flank pain. He denies vomiting.He denies penile discharge. He admits to mild bilateral testicular tenderness but no swelling or erythema. Symptoms have been present for 2 days. He denies history of UTI. He is homosexual and married but have been having other sexual encounter with other males.          Past Medical History:  Diagnosis Date  . Hypertension   . Lipoma of back 08/2014   ultrasound-confirmed   Past Surgical History:  Procedure Laterality Date  . LAPAROSCOPIC CHOLECYSTECTOMY  2005  . TONSILECTOMY, ADENOIDECTOMY, BILATERAL MYRINGOTOMY AND TUBES     Family History  Problem Relation Age of Onset  . Breast cancer Mother        mets  . Other Father        poor communication  . Breast cancer Sister   . Colon cancer Neg Hx    Social History  Substance Use Topics  . Smoking status: Never Smoker  . Smokeless tobacco: Never Used  . Alcohol use Yes     Comment: social    Review of Systems  Constitutional:       As stated in the HPI    Allergies  Patient has no known allergies.  Home Medications   Prior to Admission medications   Medication Sig Start Date End Date Taking? Authorizing Provider  lisinopril-hydrochlorothiazide (PRINZIDE,ZESTORETIC) 20-25 MG tablet Take 1 tablet by mouth daily. 09/03/16  Yes Marin Olp, MD  ciprofloxacin (CIPRO) 500 MG tablet Take 1 tablet (500 mg total) by mouth every 12 (twelve) hours. 12/30/16 01/04/17  Barry Dienes, NP   Meds Ordered and Administered this  Visit  Medications - No data to display  BP (!) 149/110 (BP Location: Left Arm)   Pulse 94   Temp 99.8 F (37.7 C) (Oral)   Resp 18   SpO2 99%  No data found.   Physical Exam  Constitutional: He is oriented to person, place, and time. He appears well-developed and well-nourished. No distress.  Neck: Normal range of motion.  Cardiovascular: Normal rate, regular rhythm and normal heart sounds.   No murmur heard. Pulmonary/Chest: Effort normal and breath sounds normal. He has no wheezes.  Abdominal: Soft. Bowel sounds are normal. There is no tenderness.  Genitourinary: Penis normal. No penile tenderness.  Genitourinary Comments: No penile discharge or rash noted. Testicles slightly sore to palpate, otherwise unremarkable.   Lymphadenopathy:    He has no cervical adenopathy.  Neurological: He is alert and oriented to person, place, and time.  Skin: Skin is warm and dry.  Nursing note and vitals reviewed.   Urgent Care Course     Procedures (including critical care time)  Labs Review Labs Reviewed  GLUCOSE, CAPILLARY - Abnormal; Notable for the following:       Result Value   Glucose-Capillary 145 (*)    All other components within normal limits  POCT URINALYSIS DIP (DEVICE) - Abnormal; Notable for the following:    Glucose, UA 100 (*)    Hgb urine dipstick TRACE (*)  Protein, ur 30 (*)    Nitrite POSITIVE (*)    Leukocytes, UA TRACE (*)    All other components within normal limits  URINE CULTURE  URINE CYTOLOGY ANCILLARY ONLY    Imaging Review No results found.  MDM   1. Pyelonephritis    1) UA has pos nitrite and leukocytes. Clinical presentation most consistent with pyelonephritis.  2) Urine culture pending.  2) GC/Chlamydia and trich pending 3) Start CIPRO 500 mg BID x 5 days. Take tylenol or ibuprofen for fever.  4) Noted glucose in urine today with a  capillary glucose of 145. This is insignificant since patient reports drinking some sugar water this  morning prior to arrival.   5) Informed to f/u with PCP for no improvement.     Barry Dienes, NP 12/30/16 1133

## 2016-12-30 NOTE — ED Notes (Signed)
Obtained "dirty" urine with patient instructions for obtaining

## 2016-12-30 NOTE — Discharge Instructions (Signed)
1) Take antibiotic as prescribed 2) We will call you with your urine culture result.

## 2016-12-30 NOTE — ED Triage Notes (Addendum)
The patient presented to the [facility] with a complaint of a fever and general body aches x 2 days and dysuria and urinary frequency.

## 2016-12-31 LAB — URINE CYTOLOGY ANCILLARY ONLY
Chlamydia: NEGATIVE
Neisseria Gonorrhea: NEGATIVE
TRICH (WINDOWPATH): NEGATIVE

## 2017-01-01 LAB — URINE CULTURE: Culture: >=100000 — AB

## 2017-10-03 ENCOUNTER — Other Ambulatory Visit: Payer: Self-pay | Admitting: Family Medicine

## 2017-10-07 ENCOUNTER — Ambulatory Visit (INDEPENDENT_AMBULATORY_CARE_PROVIDER_SITE_OTHER): Payer: BLUE CROSS/BLUE SHIELD | Admitting: Family Medicine

## 2017-10-07 ENCOUNTER — Encounter: Payer: Self-pay | Admitting: Family Medicine

## 2017-10-07 VITALS — BP 128/74 | HR 87 | Temp 98.3°F | Ht 64.5 in | Wt 184.6 lb

## 2017-10-07 DIAGNOSIS — D179 Benign lipomatous neoplasm, unspecified: Secondary | ICD-10-CM | POA: Diagnosis not present

## 2017-10-07 DIAGNOSIS — R059 Cough, unspecified: Secondary | ICD-10-CM

## 2017-10-07 DIAGNOSIS — R05 Cough: Secondary | ICD-10-CM

## 2017-10-07 DIAGNOSIS — M25561 Pain in right knee: Secondary | ICD-10-CM | POA: Diagnosis not present

## 2017-10-07 MED ORDER — AZITHROMYCIN 250 MG PO TABS: ORAL_TABLET | ORAL | 0 refills | Status: DC

## 2017-10-07 MED ORDER — IPRATROPIUM BROMIDE 0.06 % NA SOLN: 2.0000 | Freq: Four times a day (QID) | NASAL | 0 refills | Status: DC

## 2017-10-07 MED ORDER — BENZONATATE 200 MG PO CAPS: 200.0000 mg | ORAL_CAPSULE | Freq: Two times a day (BID) | ORAL | 0 refills | Status: DC | PRN

## 2017-10-07 MED ORDER — DICLOFENAC SODIUM 75 MG PO TBEC: 75.0000 mg | DELAYED_RELEASE_TABLET | Freq: Two times a day (BID) | ORAL | 0 refills | Status: DC

## 2017-10-07 NOTE — Patient Instructions (Signed)
Start the atrovent.  Start tessalon for your cough.  Start the zpack if your symptoms worsen or do not improve in a few days.  Please stay well hydrated.  Please let me know if your symptoms worsen or fail to improve.  Start the diclofenac. Use compression and ice for your knee.   Take care, Dr Jerline Pain

## 2017-10-07 NOTE — Progress Notes (Signed)
   Subjective:  Douglas Ford is a 52 y.o. male who presents today for same-day appointment with a chief complaint of knee pain.   HPI:  Knee Pain, Acute Issue Started about a year ago. Worsened over the past few days. No obvious falls, trauma, or other obvious precipitating events. Has not tried any over the counter medications.  No locking, popping, or catching.  No fevers or chills.  Symptoms worse with sitting to standing and with activity.  Cough, Acute Issue Started yesterday morning. Worse today. Associated with rhinorrhea, sore throat, and chills.  No over-the-counter treatments tried.  No known sick contacts.  Symptoms worse at night.  No other obvious alleviating or aggravating factors.  ROS: Per HPI  PMH: He reports that  has never smoked. he has never used smokeless tobacco. He reports that he drinks alcohol. He reports that he does not use drugs.  Objective:  Physical Exam: BP 128/74 (BP Location: Left Arm, Patient Position: Sitting, Cuff Size: Normal)   Pulse 87   Temp 98.3 F (36.8 C) (Oral)   Ht 5' 4.5" (1.638 m)   Wt 184 lb 9.6 oz (83.7 kg)   SpO2 95%   BMI 31.20 kg/m   Gen: NAD, resting comfortably HEENT: TMs with clear effusion bilaterally.  Oropharynx erythematous without exudate.  Maxillary sinuses decreased to transillumination bilaterally.  Nasal mucosa boggy and erythematous with thick, white nasal discharge.  No lymphadenopathy. CV: RRR with no murmurs appreciated Pulm: NWOB, CTAB with no crackles, wheezes, or rhonchi MSK: -Right knee: No deformities.  Tender to palpation along midline.  Stable to varus and valgus stress.  Anterior and posterior drawer signs negative.  Full range of motion.  Positive McMurray and Thessaly. -Back: Approximately 4 cm freely mobile mass along right lumbar spine.  Assessment/Plan:  Cough Likely secondary to viral URI. No signs of bacterial infection. Start atrovent for rhinorrhea/sinus congestion.  Start tessalon for cough.  Sent in a "pocket prescription" for azithromycin with strict instruction to not start unless symptoms worsen or fail to improve within the next several days. Recommended tylenol and/or motrin as needed for low grade fever and pain. Encouraged good oral hydration. Return precautions reviewed. Follow up as needed.   Right knee pain No red flag signs or symptoms.  Exam consistent with degenerative meniscal tear with possible underlying osteoarthritis.  We will start conservative management with compression, cold therapy, anti-inflammatories, and home exercise program.  Discussed treatment options including intra-articular steroid injection however patient deferred.  Send in diclofenac.  Return precautions reviewed.  Lipoma Exam consistent with lipoma.  Reports that they have had imaging in the past that confirmed this.  Is considering having surgery done however does not want to be referred today.  Algis Greenhouse. Jerline Pain, MD 10/07/2017 4:34 PM

## 2017-12-17 ENCOUNTER — Telehealth: Payer: Self-pay

## 2017-12-17 NOTE — Telephone Encounter (Signed)
Message from [place], Generic sent at 12/17/2017 8:55 AM EDT -----    Actually regarding Douglas Ford......    Douglas Ford will be traveling to [place] next month as part of his immigration process. We need to schedule an appointment for him to get vaccines that are required by the [facility].....Marland Kitchenthe ones we all got as kids.......Marland Kitchensee attachment.    He will need a statement showing that he has received the appropriate vaccines.    Late afternoons, 4 p.m., are usually a good time for him to get by there.    Thanks,  [employer]

## 2017-12-17 NOTE — Telephone Encounter (Signed)
thanks- where are the attachments?

## 2017-12-18 NOTE — Telephone Encounter (Signed)
noted thanks- will keep for visit tomorrow

## 2017-12-18 NOTE — Telephone Encounter (Signed)
In Todd's chart. I have printed it

## 2017-12-19 ENCOUNTER — Ambulatory Visit (INDEPENDENT_AMBULATORY_CARE_PROVIDER_SITE_OTHER): Payer: BLUE CROSS/BLUE SHIELD | Admitting: Family Medicine

## 2017-12-19 ENCOUNTER — Encounter: Payer: Self-pay | Admitting: Family Medicine

## 2017-12-19 VITALS — BP 118/88 | HR 83 | Temp 98.3°F | Ht 64.5 in | Wt 184.4 lb

## 2017-12-19 DIAGNOSIS — Z789 Other specified health status: Secondary | ICD-10-CM | POA: Diagnosis not present

## 2017-12-19 DIAGNOSIS — G44219 Episodic tension-type headache, not intractable: Secondary | ICD-10-CM

## 2017-12-19 DIAGNOSIS — K219 Gastro-esophageal reflux disease without esophagitis: Secondary | ICD-10-CM

## 2017-12-19 DIAGNOSIS — E669 Obesity, unspecified: Secondary | ICD-10-CM | POA: Diagnosis not present

## 2017-12-19 DIAGNOSIS — I10 Essential (primary) hypertension: Secondary | ICD-10-CM

## 2017-12-19 DIAGNOSIS — Z23 Encounter for immunization: Secondary | ICD-10-CM | POA: Diagnosis not present

## 2017-12-19 NOTE — Patient Instructions (Signed)
for age appropriate vaccines we came up with following plan 1. Up to date on Tdap as of 09/03/16.  2. Check for MMR immunity with titers- doesn't think he had these- would give vaccine if not immune 3. Check for varicella immunity with titers. Not sure if he had this- would give vaccine if no immunity.  4. Pneumococcal vaccines planned at age 52 5. Twinrix- start series today - repeat at 1 month and 6 months- go ahead and schedule these before you leave

## 2017-12-19 NOTE — Progress Notes (Signed)
Subjective:  Douglas Ford is a 52 y.o. year old very pleasant male patient who presents for/with See problem oriented charting ROS- No chest pain or shortness of breath. No headache or blurry vision.    Past Medical History-  Patient Active Problem List   Diagnosis Date Noted  . GERD (gastroesophageal reflux disease) 09/03/2016    Priority: Medium  . HTN (hypertension), benign 08/30/2014    Priority: Medium  . Obesity (BMI 30.0-34.9) 12/20/2017    Priority: Low  . Headache 09/03/2016    Priority: Low  . Subcutaneous mass 08/30/2014    Priority: Low    Medications- reviewed and updated Current Outpatient Medications  Medication Sig Dispense Refill  . amLODipine (NORVASC) 5 MG tablet TAKE ONE TABLET BY MOUTH DAILY. 90 tablet 0  . lisinopril-hydrochlorothiazide (PRINZIDE,ZESTORETIC) 20-25 MG tablet TAKE ONE TABLET BY MOUTH ONCE DAILY. 90 tablet 0   No current facility-administered medications for this visit.     Objective: BP 118/88 (BP Location: Left Arm, Patient Position: Sitting, Cuff Size: Large)   Pulse 83   Temp 98.3 F (36.8 C) (Oral)   Ht 5' 4.5" (1.638 m)   Wt 184 lb 6.4 oz (83.6 kg)   SpO2 94%   BMI 31.16 kg/m  Gen: NAD, resting comfortably CV: RRR no murmurs rubs or gallops Lungs: CTAB no crackles, wheeze, rhonchi Abdomen: soft/nontender/nondistended. obese Ext: no edema Skin: warm, dry Neuro: normal gait and speech  Assessment/Plan:  Measles, mumps, rubella (MMR) vaccination status unknown - Plan: Measles/Mumps/Rubella Immunity Varicella vaccination status unknown - Plan: Varicella zoster antibody, IgG Need for prophylactic vaccination and inoculation against viral hepatitis - Plan: Hepatitis A hepatitis B combined vaccine IM S: Patient is working to get his immigration process finalized- hoping to become naturalized citizen. He is from [place].   Sheet sent from his husband about potentially needed vaccines - Hep A - Hep B - Measles, mumbs, rubella -  pneumococcal (would give at age 35) -Tdap - polio (not age appropriate) - rotavirus (not age appropriate)  - Hib (not age appropriate)  - Varicella A/P:  for age appropriate vaccines we came up with following plan 1. Up to date on Tdap as of 09/03/16.  2. Check for MMR immunity with titers- doesn't think he had these- would give vaccine if not immune 3. Check for varicella immunity with titers. Not sure if he had this- would give vaccine if no immunity.  4. Pneumococcal vaccines planned at age 47 5. Twinrix- start series today - repeat at 1 month and 6 months- go ahead and schedule these before you leave  Immunization History  Administered Date(s) Administered  . Hep A / Hep B 12/19/2017  . Tdap 09/03/2016   GERD (gastroesophageal reflux disease) S: has not needed PPI or h2 blocker lately. Sparing tums A/P: continue current medications   HTN (hypertension), benign S: controlled on Lisinopril hct 20-25 mg, amlodipine 22m. Drinks fair amount of sweet tea and discussed how that can make weight loss difficult.  BP Readings from Last 3 Encounters:  12/19/17 118/88  10/07/17 128/74  12/30/16 (!) 149/110  A/P: We discussed blood pressure goal of <140/90. Continue current meds:  We also discussed role of weight loss to further help him loewr diastolic BP. He needs to schedule a physical within the next few months and we can update labs    Headache S: headaches much improved. Only with intense heat while out working A/P: discussed staying hydrated- doesn't look like he will be able to  avoid these wor situations   Obesity (BMI 30.0-34.9) S: weight trending up Wt Readings from Last 3 Encounters:  12/19/17 184 lb 6.4 oz (83.6 kg)  10/07/17 184 lb 9.6 oz (83.7 kg)  10/15/16 176 lb (79.8 kg)  A/P: Encouraged need for healthy eating, regular exercise, weight loss.  His sweet tea drinking would be a low hanging target which could produce weight loss significantly   Return precautions  advised.  Garret Reddish, MD

## 2017-12-20 DIAGNOSIS — E669 Obesity, unspecified: Secondary | ICD-10-CM | POA: Insufficient documentation

## 2017-12-20 NOTE — Assessment & Plan Note (Signed)
S: headaches much improved. Only with intense heat while out working A/P: discussed staying hydrated- doesn't look like he will be able to avoid these wor situations

## 2017-12-20 NOTE — Assessment & Plan Note (Signed)
S: controlled on Lisinopril hct 20-25 mg, amlodipine 5mg . Drinks fair amount of sweet tea and discussed how that can make weight loss difficult.  BP Readings from Last 3 Encounters:  12/19/17 118/88  10/07/17 128/74  12/30/16 (!) 149/110  A/P: We discussed blood pressure goal of <140/90. Continue current meds:  We also discussed role of weight loss to further help him loewr diastolic BP. He needs to schedule a physical within the next few months and we can update labs

## 2017-12-20 NOTE — Assessment & Plan Note (Signed)
S: has not needed PPI or h2 blocker lately. Sparing tums A/P: continue current medications

## 2017-12-20 NOTE — Assessment & Plan Note (Signed)
S: weight trending up Wt Readings from Last 3 Encounters:  12/19/17 184 lb 6.4 oz (83.6 kg)  10/07/17 184 lb 9.6 oz (83.7 kg)  10/15/16 176 lb (79.8 kg)  A/P: Encouraged need for healthy eating, regular exercise, weight loss.  His sweet tea drinking would be a low hanging target which could produce weight loss significantly

## 2017-12-22 LAB — VARICELLA ZOSTER ANTIBODY, IGG: VARICELLA IGG: 499.2 {index}

## 2017-12-22 LAB — MEASLES/MUMPS/RUBELLA IMMUNITY
Mumps IgG: 99.6 AU/mL
Rubella: <0.9 index — ABNORMAL LOW
Rubeola IgG: >300 AU/mL

## 2017-12-24 ENCOUNTER — Telehealth: Payer: Self-pay | Admitting: Family Medicine

## 2017-12-24 NOTE — Telephone Encounter (Signed)
The patient states he needs a shot but is unsure of which one. Please advise on immunizations.

## 2017-12-25 NOTE — Telephone Encounter (Signed)
I instructed him to go to the health department  To have his shots given. Do you still want him to get a shot in our office? He needs the MMR from the last phone call

## 2017-12-25 NOTE — Telephone Encounter (Signed)
We still need to give him the Twinrix shot at 1 month and 64-monthout from last visit.  I also think we should go ahead and give him the MMR vaccination.  The reason I think the health department may be helpful is just making sure/verifying that everything we have done is adequate for his immigration- I do not want there to be any barriers for him.  But as I said-we still need to provide the initial immunizations as above.  I apologize for my prior message since it seems to be confusing.  Please also apologize to patient for me.

## 2017-12-26 NOTE — Telephone Encounter (Signed)
Noted  

## 2017-12-26 NOTE — Telephone Encounter (Signed)
Pt has been scheduled for the mmr on Tues 5/28 per note. Also, anything else pt can have that day that he needs is ok.

## 2017-12-30 ENCOUNTER — Ambulatory Visit (INDEPENDENT_AMBULATORY_CARE_PROVIDER_SITE_OTHER): Payer: BLUE CROSS/BLUE SHIELD

## 2017-12-30 DIAGNOSIS — Z23 Encounter for immunization: Secondary | ICD-10-CM

## 2017-12-30 NOTE — Progress Notes (Signed)
MMR, 0.5 mL given SQ, L arm, mfg: [employer], lot#: [id number], exp: 06/06/2019, NDC: 2902-1115-52, pt tolerated well.

## 2018-01-15 ENCOUNTER — Other Ambulatory Visit: Payer: Self-pay | Admitting: Family Medicine

## 2018-11-23 ENCOUNTER — Telehealth: Payer: Self-pay | Admitting: Family Medicine

## 2018-11-23 ENCOUNTER — Encounter: Payer: Self-pay | Admitting: Family Medicine

## 2018-11-23 ENCOUNTER — Ambulatory Visit (INDEPENDENT_AMBULATORY_CARE_PROVIDER_SITE_OTHER): Payer: BLUE CROSS/BLUE SHIELD | Admitting: Family Medicine

## 2018-11-23 DIAGNOSIS — Z114 Encounter for screening for human immunodeficiency virus [HIV]: Secondary | ICD-10-CM

## 2018-11-23 DIAGNOSIS — Z113 Encounter for screening for infections with a predominantly sexual mode of transmission: Secondary | ICD-10-CM

## 2018-11-23 DIAGNOSIS — Z202 Contact with and (suspected) exposure to infections with a predominantly sexual mode of transmission: Secondary | ICD-10-CM | POA: Diagnosis not present

## 2018-11-23 DIAGNOSIS — I10 Essential (primary) hypertension: Secondary | ICD-10-CM

## 2018-11-23 DIAGNOSIS — Z1159 Encounter for screening for other viral diseases: Secondary | ICD-10-CM

## 2018-11-23 NOTE — Telephone Encounter (Signed)
Copied from [place] [id number]. Topic: General - Inquiry >> Nov 23, 2018  4:33 PM Margot Ables wrote: Reason for CRM: requesting that lab for "anti chlamydia pneumoniae igg" be added to panel for testing. Also asking if 1g of rocephin will be administered as precaution. Please advise.

## 2018-11-23 NOTE — Patient Instructions (Signed)
Health Maintenance Due  Topic Date Due  . Hepatitis C Screening  06/30/1965  . HIV Screening  07/01/1979  Will screen both with labs today  Needs updated PHQ 2 at follow-up
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
## 2018-11-24 ENCOUNTER — Other Ambulatory Visit: Payer: Self-pay

## 2018-11-24 ENCOUNTER — Other Ambulatory Visit: Payer: BLUE CROSS/BLUE SHIELD

## 2018-11-24 ENCOUNTER — Other Ambulatory Visit (HOSPITAL_COMMUNITY)
Admission: RE | Admit: 2018-11-24 | Discharge: 2018-11-24 | Disposition: A | Discharge status: Home / Self Care | Payer: BLUE CROSS/BLUE SHIELD | Source: Ambulatory Visit | Attending: Family Medicine | Admitting: Family Medicine

## 2018-11-24 DIAGNOSIS — Z114 Encounter for screening for human immunodeficiency virus [HIV]: Secondary | ICD-10-CM | POA: Diagnosis not present

## 2018-11-24 DIAGNOSIS — Z113 Encounter for screening for infections with a predominantly sexual mode of transmission: Secondary | ICD-10-CM | POA: Diagnosis not present

## 2018-11-24 DIAGNOSIS — Z202 Contact with and (suspected) exposure to infections with a predominantly sexual mode of transmission: Secondary | ICD-10-CM | POA: Diagnosis not present

## 2018-11-24 DIAGNOSIS — Z1159 Encounter for screening for other viral diseases: Secondary | ICD-10-CM | POA: Diagnosis not present

## 2018-11-24 LAB — COMPREHENSIVE METABOLIC PANEL
ALT: 19 U/L (ref 0–53)
AST: 15 U/L (ref 0–37)
Albumin: 4.8 g/dL (ref 3.5–5.2)
Alkaline Phosphatase: 36 U/L — ABNORMAL LOW (ref 39–117)
BUN: 14 mg/dL (ref 6–23)
CO2: 31 mEq/L (ref 19–32)
Calcium: 9.4 mg/dL (ref 8.4–10.5)
Chloride: 101 mEq/L (ref 96–112)
Creatinine, Ser: 0.8 mg/dL (ref 0.40–1.50)
GFR: 100.97 mL/min (ref >60.00)
Glucose, Bld: 106 mg/dL — ABNORMAL HIGH (ref 70–99)
Potassium: 3.5 mEq/L (ref 3.5–5.1)
Sodium: 141 mEq/L (ref 135–145)
Total Bilirubin: 0.7 mg/dL (ref 0.2–1.2)
Total Protein: 7.4 g/dL (ref 6.0–8.3)

## 2018-11-24 LAB — LIPID PANEL
Cholesterol: 132 mg/dL (ref 0–200)
HDL: 42.6 mg/dL (ref >39.00)
LDL Cholesterol: 66 mg/dL (ref 0–99)
NonHDL: 89.32
Total CHOL/HDL Ratio: 3
Triglycerides: 118 mg/dL (ref 0.0–149.0)
VLDL: 23.6 mg/dL (ref 0.0–40.0)

## 2018-11-24 LAB — CBC
HCT: 51.7 % (ref 39.0–52.0)
Hemoglobin: 17.7 g/dL — ABNORMAL HIGH (ref 13.0–17.0)
MCHC: 34.2 g/dL (ref 30.0–36.0)
MCV: 91.5 fl (ref 78.0–100.0)
Platelets: 190 10*3/uL (ref 150.0–400.0)
RBC: 5.65 Mil/uL (ref 4.22–5.81)
RDW: 13.2 % (ref 11.5–15.5)
WBC: 5.3 10*3/uL (ref 4.0–10.5)

## 2018-11-24 NOTE — Telephone Encounter (Signed)
Forwarding to Dr. Hunter to advise.  

## 2018-11-24 NOTE — Telephone Encounter (Signed)
See note

## 2018-11-24 NOTE — Telephone Encounter (Signed)
I typically do not order that antibody test.  I was not planning on administering Rocephin unless he became symptomatic or has positive test results.  Definitely want him to let us know if he has any penile discharge, burning with urination

## 2018-11-24 NOTE — Addendum Note (Signed)
Addended by: Francis Dowse T on: 11/24/2018 11:16 AM   Modules accepted: Orders

## 2018-11-25 ENCOUNTER — Other Ambulatory Visit: Payer: Self-pay | Admitting: Family Medicine

## 2018-11-25 LAB — URINE CYTOLOGY ANCILLARY ONLY
Chlamydia: NEGATIVE
Neisseria Gonorrhea: NEGATIVE
Trichomonas: NEGATIVE

## 2018-11-25 LAB — RPR: RPR Ser Ql: NONREACTIVE

## 2018-11-25 LAB — HIV ANTIBODY (ROUTINE TESTING W REFLEX): HIV 1&2 Ab, 4th Generation: NONREACTIVE

## 2018-11-25 LAB — HEPATITIS C ANTIBODY
Hepatitis C Ab: NONREACTIVE
SIGNAL TO CUT-OFF: 0.02 (ref <1.00)

## 2018-11-25 NOTE — Telephone Encounter (Signed)
Called patient gave all information and helped fix my chart log in. He will call if any more questions.

## 2018-12-26 DIAGNOSIS — H16292 Other keratoconjunctivitis, left eye: Secondary | ICD-10-CM | POA: Diagnosis not present

## 2019-08-25 ENCOUNTER — Other Ambulatory Visit: Payer: Self-pay | Admitting: Family Medicine

## 2019-09-28 ENCOUNTER — Telehealth: Payer: Self-pay | Admitting: Family Medicine

## 2019-09-28 ENCOUNTER — Other Ambulatory Visit: Payer: Self-pay

## 2019-09-28 NOTE — Telephone Encounter (Signed)
error 

## 2019-09-29 ENCOUNTER — Encounter: Payer: Self-pay | Admitting: Family Medicine

## 2019-09-29 ENCOUNTER — Ambulatory Visit (INDEPENDENT_AMBULATORY_CARE_PROVIDER_SITE_OTHER): Payer: BC Managed Care – PPO | Admitting: Family Medicine

## 2019-09-29 ENCOUNTER — Ambulatory Visit (INDEPENDENT_AMBULATORY_CARE_PROVIDER_SITE_OTHER): Payer: BC Managed Care – PPO

## 2019-09-29 VITALS — BP 122/76 | HR 75 | Temp 97.6°F | Ht 64.5 in | Wt 179.4 lb

## 2019-09-29 DIAGNOSIS — M25561 Pain in right knee: Secondary | ICD-10-CM | POA: Diagnosis not present

## 2019-09-29 MED ORDER — DICLOFENAC SODIUM 75 MG PO TBEC: 75.0000 mg | DELAYED_RELEASE_TABLET | Freq: Two times a day (BID) | ORAL | 0 refills | Status: DC

## 2019-09-29 NOTE — Patient Instructions (Signed)
It was very nice to see you today!  Please start the diclofenac.  We will check an x-ray today.  Please use compression on the area as much as possible.  Please place an ice pack on the area for 10 to 15 minutes twice per day for the next couple of weeks as well.  Let me know or let Dr. Yong Channel know if symptoms are not improving in the next 1 to 2 weeks.  Take care, Dr Jerline Pain  Please try these tips to maintain a healthy lifestyle:   Eat at least 3 REAL meals and 1-2 snacks per day.  Aim for no more than 5 hours between eating.  If you eat breakfast, please do so within one hour of getting up.    Each meal should contain half fruits/vegetables, one quarter protein, and one quarter carbs (no bigger than a computer mouse)   Cut down on sweet beverages. This includes juice, soda, and sweet tea.     Drink at least 1 glass of water with each meal and aim for at least 8 glasses per day   Exercise at least 150 minutes every week.

## 2019-09-29 NOTE — Progress Notes (Signed)
   Douglas Ford is a 54 y.o. male who presents today for an office visit.  Assessment/Plan:  New/Acute Problems: Knee pain No red flags.  Possibly underlying OA.  Based on location of pain may have LCL sprain as well.  Will check x-ray today given 19-month history and worsening symptoms.  Will start diclofenac 75 mg twice daily for the next 1 to 2 weeks.  Also recommended ice and compression.  Discussed reasons to return to care.    Subjective:  HPI:  Patient started having right knee pain about three months ago. Worse with climbing steps. No injuries or falls. Getting worse over the last few months. Some pain in the middle of the night. No fevers or chills. No weakness or numbness. Tried taking tylenol which helped modestly.         Objective:  Physical Exam: BP 122/76   Pulse 75   Temp 97.6 F (36.4 C)   Ht 5' 4.5" (1.638 m)   Wt 179 lb 6.1 oz (81.4 kg)   SpO2 96%   BMI 30.31 kg/m   Gen: No acute distress, resting comfortably MSK:  -Right knee: No deformities.  Tender to palpation along fibular head.  No joint line tenderness.  Normal active and passive range of motion.  Stable to varus and valgus stress.  Anterior posterior drawer signs negative.  Neurovascular intact distally.      Douglas Ford. Jerline Pain, MD 09/29/2019 8:37 AM

## 2019-09-30 NOTE — Progress Notes (Signed)
Please inform patient of the following:  Xray shows mild arthritis. Recommend he continue with the treatment plan we discussed and would like for him to let us know if not improving.  Douglas Ford. Jerline Pain, MD 09/30/2019 12:29 PM

## 2019-10-05 ENCOUNTER — Ambulatory Visit: Payer: BLUE CROSS/BLUE SHIELD | Admitting: Family Medicine

## 2019-12-28 ENCOUNTER — Other Ambulatory Visit: Payer: Self-pay | Admitting: Family Medicine

## 2020-03-30 ENCOUNTER — Ambulatory Visit
Admission: EM | Admit: 2020-03-30 | Discharge: 2020-03-30 | Disposition: A | Discharge status: Home / Self Care | Payer: 59 | Attending: Family Medicine | Admitting: Family Medicine

## 2020-03-30 ENCOUNTER — Other Ambulatory Visit: Payer: Self-pay

## 2020-03-30 DIAGNOSIS — M545 Low back pain, unspecified: Secondary | ICD-10-CM

## 2020-03-30 DIAGNOSIS — M25511 Pain in right shoulder: Secondary | ICD-10-CM

## 2020-03-30 DIAGNOSIS — M542 Cervicalgia: Secondary | ICD-10-CM

## 2020-03-30 DIAGNOSIS — S39012A Strain of muscle, fascia and tendon of lower back, initial encounter: Secondary | ICD-10-CM

## 2020-03-30 DIAGNOSIS — S161XXA Strain of muscle, fascia and tendon at neck level, initial encounter: Secondary | ICD-10-CM | POA: Diagnosis not present

## 2020-03-30 MED ORDER — IBUPROFEN 800 MG PO TABS: 800.0000 mg | ORAL_TABLET | Freq: Three times a day (TID) | ORAL | 0 refills | Status: DC | PRN

## 2020-03-30 MED ORDER — CYCLOBENZAPRINE HCL 10 MG PO TABS: 10.0000 mg | ORAL_TABLET | Freq: Two times a day (BID) | ORAL | 0 refills | Status: DC | PRN

## 2020-03-30 NOTE — Discharge Instructions (Signed)
Take ibuprofen as needed for your pain.    Take the muscle relaxer Flexeril as needed for muscle spasm; Do not drive, operate machinery, or drink alcohol with this medication as it may make you drowsy.    Follow up with your primary care provider or an orthopedist if your pain is not improving.
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
## 2020-05-01 ENCOUNTER — Other Ambulatory Visit: Payer: Self-pay | Admitting: Family Medicine

## 2020-07-24 ENCOUNTER — Other Ambulatory Visit: Payer: Self-pay | Admitting: Family Medicine

## 2020-08-10 ENCOUNTER — Other Ambulatory Visit: Payer: Self-pay | Admitting: Family Medicine

## 2020-09-10 ENCOUNTER — Other Ambulatory Visit: Payer: Self-pay | Admitting: Family Medicine

## 2020-10-05 ENCOUNTER — Other Ambulatory Visit (HOSPITAL_COMMUNITY)
Admission: RE | Admit: 2020-10-05 | Discharge: 2020-10-05 | Disposition: A | Discharge status: Home / Self Care | Payer: 59 | Source: Ambulatory Visit | Attending: Family Medicine | Admitting: Family Medicine

## 2020-10-05 ENCOUNTER — Ambulatory Visit (INDEPENDENT_AMBULATORY_CARE_PROVIDER_SITE_OTHER): Payer: 59 | Admitting: Family Medicine

## 2020-10-05 ENCOUNTER — Other Ambulatory Visit: Payer: Self-pay

## 2020-10-05 ENCOUNTER — Encounter: Payer: Self-pay | Admitting: Family Medicine

## 2020-10-05 VITALS — BP 136/94 | HR 77 | Temp 98.4°F | Ht 65.0 in | Wt 180.4 lb

## 2020-10-05 DIAGNOSIS — R509 Fever, unspecified: Secondary | ICD-10-CM

## 2020-10-05 DIAGNOSIS — I10 Essential (primary) hypertension: Secondary | ICD-10-CM

## 2020-10-05 DIAGNOSIS — R300 Dysuria: Secondary | ICD-10-CM

## 2020-10-05 DIAGNOSIS — R3589 Other polyuria: Secondary | ICD-10-CM

## 2020-10-05 LAB — POC URINALSYSI DIPSTICK (AUTOMATED)
Bilirubin, UA: NEGATIVE
Glucose, UA: NEGATIVE
Nitrite, UA: POSITIVE
Protein, UA: POSITIVE — AB
Spec Grav, UA: >=1.03 — AB (ref 1.010–1.025)
Urobilinogen, UA: 1 E.U./dL
pH, UA: 6 (ref 5.0–8.0)

## 2020-10-05 MED ORDER — SULFAMETHOXAZOLE-TRIMETHOPRIM 800-160 MG PO TABS: 1.0000 | ORAL_TABLET | Freq: Two times a day (BID) | ORAL | 0 refills | Status: DC

## 2020-10-05 NOTE — Progress Notes (Signed)
Phone [phone] In person visit   Subjective:   Douglas Ford is a 55 y.o. year old very pleasant male patient who presents for/with See problem oriented charting Chief Complaint  Patient presents with   Urinary Retention   Testicle Pain    And pressure.     This visit occurred during the SARS-CoV-2 public health emergency.  Safety protocols were in place, including screening questions prior to the visit, additional usage of staff PPE, and extensive cleaning of exam room while observing appropriate contact time as indicated for disinfecting solutions.   Past Medical History-  Patient Active Problem List   Diagnosis Date Noted   GERD (gastroesophageal reflux disease) 09/03/2016    Priority: Medium   HTN (hypertension), benign 08/30/2014    Priority: Medium   Obesity (BMI 30.0-34.9) 12/20/2017    Priority: Low   Headache 09/03/2016    Priority: Low   Subcutaneous mass 08/30/2014    Priority: Low    Medications- reviewed and updated Current Outpatient Medications  Medication Sig Dispense Refill   amLODipine (NORVASC) 5 MG tablet Take 1 tablet by mouth once daily 90 tablet 0   cyclobenzaprine (FLEXERIL) 10 MG tablet Take 1 tablet (10 mg total) by mouth 2 (two) times daily as needed for muscle spasms. 20 tablet 0   diclofenac (VOLTAREN) 75 MG EC tablet Take 1 tablet (75 mg total) by mouth 2 (two) times daily. 30 tablet 0   ibuprofen (ADVIL) 800 MG tablet Take 1 tablet (800 mg total) by mouth every 8 (eight) hours as needed for moderate pain. 21 tablet 0   lisinopril-hydrochlorothiazide (ZESTORETIC) 20-25 MG tablet TAKE 1 TABLET BY MOUTH ONCE DAILY . APPOINTMENT REQUIRED FOR FUTURE REFILLS 30 tablet 0   sulfamethoxazole-trimethoprim (BACTRIM DS) 800-160 MG tablet Take 1 tablet by mouth 2 (two) times daily. 84 tablet 0   No current facility-administered medications for this visit.     Objective:  BP (!) 136/94    Pulse 77    Temp 98.4 F (36.9 C) (Temporal)     Ht 5\' 5"  (1.651 m)    Wt 180 lb 6.4 oz (81.8 kg)    SpO2 97%    BMI 30.02 kg/m  Gen: NAD, resting comfortably CV: RRR no murmurs rubs or gallops Lungs: CTAB no crackles, wheeze, rhonchi Abdomen: soft/nontender other than mild tenderness in lower abdomen with deep palpation/nondistended/normal bowel sounds. No rebound or guarding.  Ext: no edema Skin: warm, dry Tender boggy prostate. Testicular exam normal bilaterally- no obvious hernias    Assessment and Plan   # dysuria with urgency and frequency, testicular pain, fever S:started Monday this week with some subjective fevers but did not have temperature above 100.5- also had some chills- later did have a fever next day to 101 on Tuesday. Tylenol helped and has not had fever since that time.   Next day Wednesday went to work and tried to [employer] but could not pee easily. Was going to bathroom every 5 minutes . Also felt some fatigue.  Rushes to bathroom but then cannot pee easily. Some burning with urination. No discharge from penis. Has happened in the past where he had trouble in past and alcohol helped. Has taken some AZO. Some burning with ejaculation as well  He is vaccinated from covid 19. Patient monogamous with husband A/P: patient with boggy tender prostate as well as urinary urgency, hesitancy, fever earlier this week, and overall poor feeling. Strongly suspect prostatitis. Labs as below but go ahead and start bactrim  DS x 42 days to help with full clearance.  Considered cipro but considered about tendon issues as active at work.   Discussed possible flomax but will hold for now- also advised CPE- needs one of these to check PSA once acute issues have resolved- should follow up if new or worsening symptoms and also advised 3 week recheck  Strongly doubt covid but with fever and fatigue will test today  #hypertension S: medication: amlodipine 5 mg and lisinopril hctz 20-25 mg typically but has not taken yet today BP Readings from Last  3 Encounters:  10/05/20 (!) 136/94  03/30/20 125/82  09/29/19 122/76  A/P: poor control in office but has not taken meds- asked patient to start meds as soon as he gets home and uspect will be controlled as has been in the past   Recommended follow up: as needed for acute concerns if worsen- or 2-3 week follow up. Plus needs CPE   Lab/Order associations:   ICD-10-CM   1. Polyuria  R35.89 POCT Urinalysis Dipstick (Automated)    Urine Culture    Urine cytology ancillary only    Gram stain  2. Dysuria  R30.0 POCT Urinalysis Dipstick (Automated)    Urine Culture    Urine cytology ancillary only    Gram stain  3. Fever, unspecified fever cause  R50.9 Novel Coronavirus, NAA ([facility])  4. HTN (hypertension), benign  I10     Meds ordered this encounter  Medications   sulfamethoxazole-trimethoprim (BACTRIM DS) 800-160 MG tablet    Sig: Take 1 tablet by mouth 2 (two) times daily.    Dispense:  84 tablet    Refill:  0    Prostatitis treatment   Time Spent: 33 minutes of total time (1:27 PM- 2:00 PM) was spent on the date of the encounter performing the following actions: chart review prior to seeing the patient, obtaining history, performing a medically necessary exam, counseling on the treatment plan, placing orders, and documenting in our EHR.    Return precautions advised.  Garret Reddish, MD

## 2020-10-05 NOTE — Addendum Note (Signed)
Addended by: Jacob Moores on: 10/05/2020 02:26 PM   Modules accepted: Orders

## 2020-10-05 NOTE — Patient Instructions (Addendum)
Health Maintenance Due  Topic Date Due  . INFLUENZA VACCINE Has had doesn't remember date or month. He agrees to call us back with the date.  Never done   Please stop by lab before you go Team he only needs a urine test- please help him with this as needs dirty urine and regular urine. He needs some water as well- let him sit in room. Then because of prior fever please excuse him out the side door and then shut down bathroom for 2 hours and will need to be cleaned after that If you have mychart- we will send your results within 3 business days of [place] receiving them.  If you do not have mychart- we will call you about results within 5 business days of [place] receiving them.  *please also note that you will see labs on mychart as soon as they post. I will later go in and write notes on them- will say "notes from Dr. Yong Channel"  Take antibiotic for 6 weeks. It would be reasonable to check back in 2-3 weeks from now or sooner if symptoms worsen to make sure you are improving  Recommended follow up: schedule physical within 6 months- id like to update labs

## 2020-10-05 NOTE — Addendum Note (Signed)
Addended by: Brandy Hale on: 10/05/2020 02:21 PM   Modules accepted: Orders

## 2020-10-06 ENCOUNTER — Ambulatory Visit: Payer: 59 | Admitting: Family Medicine

## 2020-10-06 LAB — URINE CYTOLOGY ANCILLARY ONLY
Chlamydia: NEGATIVE
Comment: NEGATIVE
Comment: NEGATIVE
Comment: NORMAL
Neisseria Gonorrhea: NEGATIVE
Trichomonas: NEGATIVE

## 2020-10-06 LAB — GRAM STAIN
MICRO NUMBER:: 11603600
SPECIMEN QUALITY:: ADEQUATE

## 2020-10-06 LAB — SARS-COV-2, NAA 2 DAY TAT

## 2020-10-06 LAB — NOVEL CORONAVIRUS, NAA: SARS-CoV-2, NAA: NOT DETECTED

## 2020-10-09 LAB — URINE CULTURE
MICRO NUMBER:: 11608671
SPECIMEN QUALITY:: ADEQUATE

## 2020-11-20 ENCOUNTER — Other Ambulatory Visit: Payer: Self-pay | Admitting: Family Medicine

## 2020-12-16 ENCOUNTER — Other Ambulatory Visit: Payer: Self-pay | Admitting: Family Medicine

## 2021-01-15 ENCOUNTER — Other Ambulatory Visit: Payer: Self-pay | Admitting: Family Medicine

## 2021-02-14 ENCOUNTER — Other Ambulatory Visit: Payer: Self-pay | Admitting: Family Medicine

## 2021-03-06 ENCOUNTER — Other Ambulatory Visit: Payer: Self-pay

## 2021-03-06 ENCOUNTER — Ambulatory Visit
Admission: RE | Admit: 2021-03-06 | Discharge: 2021-03-06 | Disposition: A | Discharge status: Home / Self Care | Payer: 59 | Source: Ambulatory Visit

## 2021-03-06 VITALS — BP 144/108 | HR 94 | Temp 98.6°F | Ht 64.0 in | Wt 180.0 lb

## 2021-03-06 DIAGNOSIS — J069 Acute upper respiratory infection, unspecified: Secondary | ICD-10-CM

## 2021-03-06 MED ORDER — BENZONATATE 200 MG PO CAPS: 200.0000 mg | ORAL_CAPSULE | Freq: Three times a day (TID) | ORAL | 0 refills | Status: AC | PRN

## 2021-03-06 MED ORDER — DM-GUAIFENESIN ER 30-600 MG PO TB12: 1.0000 | ORAL_TABLET | Freq: Two times a day (BID) | ORAL | 0 refills | Status: DC

## 2021-03-06 MED ORDER — CETIRIZINE HCL 10 MG PO CAPS: 10.0000 mg | ORAL_CAPSULE | Freq: Every day | ORAL | 0 refills | Status: DC

## 2021-03-06 MED ORDER — IBUPROFEN 800 MG PO TABS: 800.0000 mg | ORAL_TABLET | Freq: Three times a day (TID) | ORAL | 0 refills | Status: DC

## 2021-03-06 NOTE — ED Triage Notes (Signed)
Pt seen in UC w/ c/o cough, headache, and body aches. Pt states his chest has began to hurt from his cough. Pot denies any known sick contacts. Pt endorses taking two tylenol PTA.

## 2021-03-06 NOTE — ED Provider Notes (Signed)
[facility]    CSN: [id number] Arrival date & time: 03/06/21  [id number]      History   Chief Complaint Chief Complaint  Patient presents with   Cough   Generalized Body Aches   Headache    HPI Douglas Ford is a 55 y.o. male history of hypertension presenting today for evaluation of chest discomfort and sore throat.  Reports that symptoms began approximately 3 to 4 days ago.  Reports associated congestion cough fatigue and body aches.  Denies any known fevers.  Has developed a chest discomfort with coughing, denies at rest.  Denies difficulty breathing.  Using over-the-counter medicine without relief.  Declines performing testing at home for COVID.  HPI  Past Medical History:  Diagnosis Date   Hypertension    Lipoma of back 08/2014   ultrasound-confirmed    Patient Active Problem List   Diagnosis Date Noted   Obesity (BMI 30.0-34.9) 12/20/2017   GERD (gastroesophageal reflux disease) 09/03/2016   Headache 09/03/2016   HTN (hypertension), benign 08/30/2014   Subcutaneous mass 08/30/2014    Past Surgical History:  Procedure Laterality Date   LAPAROSCOPIC CHOLECYSTECTOMY  2005   TONSILECTOMY, ADENOIDECTOMY, BILATERAL MYRINGOTOMY AND TUBES         Home Medications    Prior to Admission medications   Medication Sig Start Date End Date Taking? Authorizing Provider  acetaminophen (TYLENOL) 325 MG tablet Take 650 mg by mouth every 6 (six) hours as needed.   Yes [provider]  benzonatate (TESSALON) 200 MG capsule Take 1 capsule (200 mg total) by mouth 3 (three) times daily as needed for up to 7 days for cough. 03/06/21 03/13/21 Yes Jahzier Villalon C, PA-C  Cetirizine HCl 10 MG CAPS Take 1 capsule (10 mg total) by mouth daily for 10 days. 03/06/21 03/16/21 Yes Antonio Creswell C, PA-C  dextromethorphan-guaiFENesin (MUCINEX DM) 30-600 MG 12hr tablet Take 1 tablet by mouth 2 (two) times daily. 03/06/21  Yes Gordan Grell C, PA-C  ibuprofen (ADVIL) 800 MG tablet Take  1 tablet (800 mg total) by mouth 3 (three) times daily. 03/06/21  Yes Taquila Leys C, PA-C  amLODipine (NORVASC) 5 MG tablet Take 1 tablet by mouth once daily 02/14/21   Marin Olp, MD  lisinopril-hydrochlorothiazide (ZESTORETIC) 20-25 MG tablet TAKE 1 TABLET BY MOUTH ONCE DAILY . APPOINTMENT REQUIRED FOR FUTURE REFILLS 02/14/21   Marin Olp, MD    Family History Family History  Problem Relation Age of Onset   Breast cancer Mother        mets   Other Father        poor communication   Breast cancer Sister    Colon cancer Neg Hx     Social History Social History   Tobacco Use   Smoking status: Never   Smokeless tobacco: Never  Substance Use Topics   Alcohol use: Yes    Comment: social   Drug use: No     Allergies   Patient has no known allergies.   Review of Systems Review of Systems  Constitutional:  Negative for activity change, appetite change, chills, fatigue and fever.  HENT:  Positive for congestion, rhinorrhea and sore throat. Negative for ear pain, sinus pressure and trouble swallowing.   Eyes:  Negative for discharge and redness.  Respiratory:  Positive for cough. Negative for chest tightness and shortness of breath.   Cardiovascular:  Positive for chest pain.  Gastrointestinal:  Negative for abdominal pain, diarrhea, nausea and vomiting.  Musculoskeletal:  Negative for myalgias.  Skin:  Negative for rash.  Neurological:  Negative for dizziness, light-headedness and headaches.    Physical Exam Triage Vital Signs ED Triage Vitals  Enc Vitals Group     BP      Pulse      Resp      Temp      Temp src      SpO2      Weight      Height      Head Circumference      Peak Flow      Pain Score      Pain Loc      Pain Edu?      Excl. in [place]?    No data found.  Updated Vital Signs BP (!) 144/108 (BP Location: Right Arm)   Pulse 94   Temp 98.6 F (37 C) (Oral)   Ht '5\' 4"'$  (1.626 m)   Wt 180 lb (81.6 kg)   SpO2 96%   BMI 30.90 kg/m    Visual Acuity Right Eye Distance:   Left Eye Distance:   Bilateral Distance:    Right Eye Near:   Left Eye Near:    Bilateral Near:     Physical Exam Vitals and nursing note reviewed.  Constitutional:      Appearance: He is well-developed.     Comments: No acute distress  HENT:     Head: Normocephalic and atraumatic.     Ears:     Comments: Bilateral ears without tenderness to palpation of external auricle, tragus and mastoid, EAC's without erythema or swelling, TM's with good bony landmarks and cone of light. Non erythematous.       Nose: Nose normal.     Mouth/Throat:     Comments: Oral mucosa pink and moist, no tonsillar enlargement or exudate. Posterior pharynx patent and nonerythematous, no uvula deviation or swelling. Normal phonation.  Eyes:     Conjunctiva/sclera: Conjunctivae normal.  Cardiovascular:     Rate and Rhythm: Normal rate and regular rhythm.  Pulmonary:     Effort: Pulmonary effort is normal. No respiratory distress.     Comments: Breathing comfortably at rest, CTABL, no wheezing, rales or other adventitious sounds auscultated  Abdominal:     General: There is no distension.  Musculoskeletal:        General: Normal range of motion.     Cervical back: Neck supple.  Skin:    General: Skin is warm and dry.  Neurological:     Mental Status: He is alert and oriented to person, place, and time.     UC Treatments / Results  Labs (all labs ordered are listed, but only abnormal results are displayed) Labs Reviewed  NOVEL CORONAVIRUS, NAA    EKG   Radiology No results found.  Procedures Procedures (including critical care time)  Medications Ordered in UC Medications - No data to display  Initial Impression / Assessment and Plan / UC Course  I have reviewed the triage vital signs and the nursing notes.  Pertinent labs & imaging results that were available during my care of the patient were reviewed by me and considered in my medical  decision making (see chart for details).     Viral URI with cough-exam reassuring, vital signs stable, recommending symptomatic and supportive care rest and fluids, COVID test pending for screening.  Recommendations provided, continue to monitor.  Discussed strict return precautions. Patient verbalized understanding and is agreeable with plan.  Final Clinical  Impressions(s) / UC Diagnoses   Final diagnoses:  Viral URI with cough     Discharge Instructions      COVID test pending, monitor MyChart for results Rest and fluids Tylenol and ibuprofen for body aches, headaches, chest discomfort Begin daily cetirizine to help with congestion and postnasal drainage/throat irritation Mucinex DM twice daily to further help with congestion/cough Tessalon/benzonatate every 8 hours for cough May use other over-the-counter medicine if preferred for cough and congestion Please return if not improving or worsening     ED Prescriptions     Medication Sig Dispense Auth. Provider   Cetirizine HCl 10 MG CAPS Take 1 capsule (10 mg total) by mouth daily for 10 days. 10 capsule Breion Novacek C, PA-C   dextromethorphan-guaiFENesin (MUCINEX DM) 30-600 MG 12hr tablet Take 1 tablet by mouth 2 (two) times daily. 15 tablet Edwing Figley C, PA-C   benzonatate (TESSALON) 200 MG capsule Take 1 capsule (200 mg total) by mouth 3 (three) times daily as needed for up to 7 days for cough. 28 capsule Kaylor Simenson C, PA-C   ibuprofen (ADVIL) 800 MG tablet Take 1 tablet (800 mg total) by mouth 3 (three) times daily. 21 tablet Kristoph Sattler, Henrietta C, PA-C      PDMP not reviewed this encounter.   Joneen Caraway Vaughn C, PA-C 03/06/21 1024

## 2021-03-06 NOTE — Discharge Instructions (Addendum)
COVID test pending, monitor MyChart for results Rest and fluids Tylenol and ibuprofen for body aches, headaches, chest discomfort Begin daily cetirizine to help with congestion and postnasal drainage/throat irritation Mucinex DM twice daily to further help with congestion/cough Tessalon/benzonatate every 8 hours for cough May use other over-the-counter medicine if preferred for cough and congestion Please return if not improving or worsening

## 2021-03-08 LAB — SARS-COV-2, NAA 2 DAY TAT

## 2021-03-08 LAB — NOVEL CORONAVIRUS, NAA: SARS-CoV-2, NAA: DETECTED — AB

## 2021-03-16 ENCOUNTER — Other Ambulatory Visit: Payer: Self-pay | Admitting: Family Medicine

## 2021-04-13 ENCOUNTER — Other Ambulatory Visit: Payer: Self-pay

## 2021-04-13 ENCOUNTER — Ambulatory Visit (INDEPENDENT_AMBULATORY_CARE_PROVIDER_SITE_OTHER): Payer: 59

## 2021-04-13 DIAGNOSIS — Z23 Encounter for immunization: Secondary | ICD-10-CM

## 2021-04-13 NOTE — Progress Notes (Signed)
    [facility]  Name:  Greory Wallett    MRN: [id number] DOB: Aug 24, 1965   04/13/2021  Mr. Petion was observed post JYNNEOS immunization for 15 minutes without incident. He was provided with Vaccine Information Sheet and instruction to access the V-Safe system.   Mr. Baade was instructed to call 911 with any severe reactions post vaccine: Difficulty breathing  Swelling of face and throat  A fast heartbeat  A bad rash all over body  Dizziness and weakness     Shannan Garfinkel T Brooks Sailors

## 2021-04-15 ENCOUNTER — Other Ambulatory Visit: Payer: Self-pay | Admitting: Family Medicine

## 2021-04-16 ENCOUNTER — Other Ambulatory Visit (HOSPITAL_COMMUNITY)
Admission: RE | Admit: 2021-04-16 | Discharge: 2021-04-16 | Disposition: A | Discharge status: Home / Self Care | Payer: 59 | Source: Ambulatory Visit | Attending: Family Medicine | Admitting: Family Medicine

## 2021-04-16 ENCOUNTER — Encounter: Payer: Self-pay | Admitting: Family Medicine

## 2021-04-16 ENCOUNTER — Other Ambulatory Visit: Payer: Self-pay

## 2021-04-16 ENCOUNTER — Ambulatory Visit (INDEPENDENT_AMBULATORY_CARE_PROVIDER_SITE_OTHER): Payer: 59 | Admitting: Family Medicine

## 2021-04-16 VITALS — BP 110/78 | HR 64 | Temp 98.7°F | Resp 16 | Wt 179.6 lb

## 2021-04-16 DIAGNOSIS — Z0000 Encounter for general adult medical examination without abnormal findings: Secondary | ICD-10-CM | POA: Insufficient documentation

## 2021-04-16 DIAGNOSIS — Z113 Encounter for screening for infections with a predominantly sexual mode of transmission: Secondary | ICD-10-CM | POA: Diagnosis present

## 2021-04-16 DIAGNOSIS — I10 Essential (primary) hypertension: Secondary | ICD-10-CM

## 2021-04-16 DIAGNOSIS — Z0001 Encounter for general adult medical examination with abnormal findings: Secondary | ICD-10-CM | POA: Diagnosis not present

## 2021-04-16 DIAGNOSIS — R300 Dysuria: Secondary | ICD-10-CM

## 2021-04-16 DIAGNOSIS — Z118 Encounter for screening for other infectious and parasitic diseases: Secondary | ICD-10-CM

## 2021-04-16 DIAGNOSIS — Z125 Encounter for screening for malignant neoplasm of prostate: Secondary | ICD-10-CM | POA: Diagnosis not present

## 2021-04-16 DIAGNOSIS — Z114 Encounter for screening for human immunodeficiency virus [HIV]: Secondary | ICD-10-CM | POA: Diagnosis not present

## 2021-04-16 NOTE — Progress Notes (Signed)
Phone: [phone]   Subjective:  Patient presents today for their annual physical. Chief complaint-noted.   See problem oriented charting- Review of Systems  Constitutional:  Negative for chills and fever.  HENT:  Negative for congestion, hearing loss and nosebleeds.   Eyes:  Positive for blurred vision (mild- needs updated exam- may consider contacts glasses get sweaty outside). Negative for double vision.  Respiratory:  Negative for cough and shortness of breath.   Cardiovascular:  Negative for chest pain and palpitations.  Gastrointestinal:  Negative for abdominal pain, constipation, diarrhea, heartburn, nausea and vomiting.  Genitourinary:  Negative for dysuria and frequency.  Musculoskeletal:  Positive for back pain. Negative for joint pain.  Skin:  Negative for itching and rash.  Neurological:  Negative for dizziness and headaches.  Endo/Heme/Allergies:  Does not bruise/bleed easily.  Psychiatric/Behavioral:  Negative for depression and suicidal ideas.    The following were reviewed and entered/updated in epic: Past Medical History:  Diagnosis Date   Hypertension    Lipoma of back 08/2014   ultrasound-confirmed   Patient Active Problem List   Diagnosis Date Noted   GERD (gastroesophageal reflux disease) 09/03/2016    Priority: Medium   HTN (hypertension), benign 08/30/2014    Priority: Medium   Obesity (BMI 30.0-34.9) 12/20/2017    Priority: Low   Headache 09/03/2016    Priority: Low   Subcutaneous mass 08/30/2014    Priority: Low   Past Surgical History:  Procedure Laterality Date   LAPAROSCOPIC CHOLECYSTECTOMY  2005   TONSILECTOMY, ADENOIDECTOMY, BILATERAL MYRINGOTOMY AND TUBES      Family History  Problem Relation Age of Onset   Breast cancer Mother        mets   Other Father        poor communication   Breast cancer Sister    Non-Hodgkin's lymphoma Brother    Colon cancer Neg Hx     Medications- reviewed and updated Current Outpatient  Medications  Medication Sig Dispense Refill   acetaminophen (TYLENOL) 325 MG tablet Take 650 mg by mouth every 6 (six) hours as needed.     amLODipine (NORVASC) 5 MG tablet Take 1 tablet by mouth once daily 30 tablet 0   ibuprofen (ADVIL) 800 MG tablet Take 1 tablet (800 mg total) by mouth 3 (three) times daily. 21 tablet 0   lisinopril-hydrochlorothiazide (ZESTORETIC) 20-25 MG tablet Take 1 tablet by mouth once daily 30 tablet 0   Cetirizine HCl 10 MG CAPS Take 1 capsule (10 mg total) by mouth daily for 10 days. 10 capsule 0   No current facility-administered medications for this visit.    Allergies-reviewed and updated No Known Allergies  Social History   Social History Narrative   Married (same sex marriage) 07/17/13 , no children.   -lives with husband      Ed: HS   Orig from [place].  Has lived in [place] since about 2000.   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       Hobbies: watching tv, resting.    Objective  Objective:  BP 110/78   Pulse 64   Temp 98.7 F (37.1 C)   Resp 16   Wt 179 lb 9.6 oz (81.5 kg)   SpO2 96%   BMI 30.83 kg/m  Gen: NAD, resting comfortably HEENT: Mucous membranes are moist. Oropharynx normal Neck: no thyromegaly CV: RRR no murmurs rubs or gallops Lungs: CTAB no crackles, wheeze, rhonchi Abdomen: soft/nontender/nondistended/normal bowel sounds. No rebound or guarding.  Ext: no  edema Skin: warm, dry Neuro: grossly normal, moves all extremities, PERRLA    Assessment and Plan  55 y.o. male presenting for annual physical.  Health Maintenance counseling: 1. Anticipatory guidance: Patient counseled regarding regular dental exams -q6 months, eye exams -yearly and has glasses but needs update- may need contacts,  avoiding smoking and second hand smoke , limiting alcohol to 2 beverages per day - minimal intake, no illicit drugs.   2. Risk factor reduction:  Advised patient of need for regular exercise and diet rich and fruits and  vegetables to reduce risk of heart attack and stroke. Exercise- needs to improve- has equipment at home but not using. Diet-discussed reducing pastas/breads/sugars- that is his goal.  Wt Readings from Last 3 Encounters:  04/16/21 179 lb 9.6 oz (81.5 kg)  03/06/21 180 lb (81.6 kg)  10/05/20 180 lb 6.4 oz (81.8 kg)  3. Immunizations/screenings/ancillary studies DISCUSSED:  - Shingrix vaccination #1 - consider next year -COVID-19 vaccination #3 repeat planned - recommended omicron booster -Flu vaccination #1 - declined -doing smallpox/monkeypox vaccines- will be going back to [place]- has upcoming visit Immunization History  Administered Date(s) Administered   Hep A / Hep B 12/19/2017   MMR 12/30/2017   PFIZER(Purple Top)SARS-COV-2 Vaccination 05/23/2020, 06/13/2020   Tdap 09/03/2016   Vaccinia,smallpox Monkeypox Vaccine Live,pf 04/13/2021   4. Prostate cancer screening- we will start trending PSA today   5. Colon cancer screening - Colonoscopy 10/15/2016 - next in 10 years 6. Skin cancer screening- no dermatologist. advised regular sunscreen use. Denies worrisome, changing, or new skin lesions.  7. Never smoker 8. STD screening - opts in- monogomous with husband but wants to be careful  Status of chronic or acute concerns   # ED visit viral URI - ended up being covid early august- discussed delaying vaccine for at least 3 months  #prostatis  - did well with treatment in march  #hypertension S: medication: Amlodipine 5 mg daily and Lisinopril hctz 20-25 mg  daily - was poor controlled on medications.  BP Readings from Last 3 Encounters:  04/16/21 110/78  03/06/21 (!) 144/108  10/05/20 (!) 136/94  A/P: Excellent control of blood pressure back on medications-continue current treatment.  Update labs as below including lipid panel  Recommended follow up: Return in about 6 months (around 10/14/2021) for follow-up or sooner if needed.. Future Appointments  Date Time Provider [place]  05/25/2021  9:45 AM [facility]   Lab/Order associations:NOT fasting   ICD-10-CM   1. Preventative health care  Z00.00 CBC with Differential/Platelet    Comprehensive metabolic panel    Lipid panel    PSA    HIV Antibody (routine testing w rflx)    RPR    Urine cytology ancillary only    2. HTN (hypertension), benign  I10 CBC with Differential/Platelet    Comprehensive metabolic panel    Lipid panel    3. Dysuria  R30.0     4. Screening for HIV (human immunodeficiency virus)  Z11.4 HIV Antibody (routine testing w rflx)    5. Screening examination for venereal disease  Z11.3 RPR    6. Screening for gonorrhea  Z11.3 Urine cytology ancillary only    7. Screening for chlamydial disease  Z11.8 Urine cytology ancillary only    8. Screening for prostate cancer  Z12.5 PSA     No orders of the defined types were placed in this encounter.  I,Jada Bradford,acting as a scribe for Garret Reddish, MD.,have documented  all relevant documentation on the behalf of Garret Reddish, MD,as directed by  Garret Reddish, MD while in the presence of Garret Reddish, MD.  I, Garret Reddish, MD, have reviewed all documentation for this visit. The documentation on 04/16/21 for the exam, diagnosis, procedures, and orders are all accurate and complete.  Return precautions advised.  Garret Reddish, MD

## 2021-04-16 NOTE — Patient Instructions (Addendum)
Health Maintenance Due  Topic Date Due   Zoster Vaccines- Shingrix (1 of 2)  - Consider getting shingles shot next year.  Never done   COVID-19 Vaccine (3 - Booster for [employer] series) - Please consider new Omicron-Specific shot in the Fall. Wait 3 months from having covid at least- November at earliest  11/11/2020   The goal for exercise is 150 minutes a week!  Work on 5-10 lbs off at least by follow up. Consider using MyFitnessPal to count calories.   Please stop by lab before you go URINE ONLY If you have mychart- we will send your results within 3 business days of [place] receiving them.  If you do not have mychart- we will call you about results within 5 business days of [place] receiving them.  *please also note that you will see labs on mychart as soon as they post. I will later go in and write notes on them- will say "notes from Dr. Yong Channel"  Recommended follow up: Return in about 6 months (around 10/14/2021) for follow-up or sooner if needed.Marland Kitchen

## 2021-04-17 LAB — CBC WITH DIFFERENTIAL/PLATELET
Basophils Absolute: 0 10*3/uL (ref 0.0–0.1)
Basophils Relative: 0.9 % (ref 0.0–3.0)
Eosinophils Absolute: 0.1 10*3/uL (ref 0.0–0.7)
Eosinophils Relative: 2.5 % (ref 0.0–5.0)
HCT: 47 % (ref 39.0–52.0)
Hemoglobin: 16.1 g/dL (ref 13.0–17.0)
Lymphocytes Relative: 40 % (ref 12.0–46.0)
Lymphs Abs: 2.3 10*3/uL (ref 0.7–4.0)
MCHC: 34.3 g/dL (ref 30.0–36.0)
MCV: 89.3 fl (ref 78.0–100.0)
Monocytes Absolute: 0.5 10*3/uL (ref 0.1–1.0)
Monocytes Relative: 8.9 % (ref 3.0–12.0)
Neutro Abs: 2.7 10*3/uL (ref 1.4–7.7)
Neutrophils Relative %: 47.7 % (ref 43.0–77.0)
Platelets: 215 10*3/uL (ref 150.0–400.0)
RBC: 5.26 Mil/uL (ref 4.22–5.81)
RDW: 13.6 % (ref 11.5–15.5)
WBC: 5.6 10*3/uL (ref 4.0–10.5)

## 2021-04-17 LAB — COMPREHENSIVE METABOLIC PANEL
ALT: 32 U/L (ref 0–53)
AST: 20 U/L (ref 0–37)
Albumin: 4.4 g/dL (ref 3.5–5.2)
Alkaline Phosphatase: 39 U/L (ref 39–117)
BUN: 15 mg/dL (ref 6–23)
CO2: 28 mEq/L (ref 19–32)
Calcium: 9.2 mg/dL (ref 8.4–10.5)
Chloride: 103 mEq/L (ref 96–112)
Creatinine, Ser: 0.85 mg/dL (ref 0.40–1.50)
GFR: 97.63 mL/min (ref >60.00)
Glucose, Bld: 112 mg/dL — ABNORMAL HIGH (ref 70–99)
Potassium: 3.6 mEq/L (ref 3.5–5.1)
Sodium: 139 mEq/L (ref 135–145)
Total Bilirubin: 0.4 mg/dL (ref 0.2–1.2)
Total Protein: 7.2 g/dL (ref 6.0–8.3)

## 2021-04-17 LAB — HIV ANTIBODY (ROUTINE TESTING W REFLEX): HIV 1&2 Ab, 4th Generation: NONREACTIVE

## 2021-04-17 LAB — PSA: PSA: 1.25 ng/mL (ref 0.10–4.00)

## 2021-04-17 LAB — LIPID PANEL
Cholesterol: 131 mg/dL (ref 0–200)
HDL: 41.3 mg/dL (ref >39.00)
LDL Cholesterol: 64 mg/dL (ref 0–99)
NonHDL: 89.27
Total CHOL/HDL Ratio: 3
Triglycerides: 128 mg/dL (ref 0.0–149.0)
VLDL: 25.6 mg/dL (ref 0.0–40.0)

## 2021-04-17 LAB — RPR: RPR Ser Ql: NONREACTIVE

## 2021-04-17 NOTE — Addendum Note (Signed)
Addended by: Clyde Lundborg A on: 04/17/2021 10:53 AM   Modules accepted: Orders

## 2021-04-18 LAB — URINE CYTOLOGY ANCILLARY ONLY
Chlamydia: NEGATIVE
Comment: NEGATIVE
Comment: NEGATIVE
Comment: NORMAL
Neisseria Gonorrhea: NEGATIVE
Trichomonas: NEGATIVE

## 2021-05-25 ENCOUNTER — Emergency Department (HOSPITAL_COMMUNITY)
Admission: EM | Admit: 2021-05-25 | Discharge: 2021-05-26 | Disposition: A | Discharge status: Home / Self Care | Payer: Worker's Compensation | Attending: Emergency Medicine | Admitting: Emergency Medicine

## 2021-05-25 ENCOUNTER — Encounter (HOSPITAL_COMMUNITY): Payer: Self-pay | Admitting: Emergency Medicine

## 2021-05-25 ENCOUNTER — Other Ambulatory Visit: Payer: Self-pay

## 2021-05-25 ENCOUNTER — Ambulatory Visit: Payer: 59

## 2021-05-25 DIAGNOSIS — T1502XA Foreign body in cornea, left eye, initial encounter: Secondary | ICD-10-CM | POA: Diagnosis not present

## 2021-05-25 DIAGNOSIS — Y9289 Other specified places as the place of occurrence of the external cause: Secondary | ICD-10-CM | POA: Insufficient documentation

## 2021-05-25 DIAGNOSIS — Z79899 Other long term (current) drug therapy: Secondary | ICD-10-CM | POA: Diagnosis not present

## 2021-05-25 DIAGNOSIS — X58XXXA Exposure to other specified factors, initial encounter: Secondary | ICD-10-CM | POA: Insufficient documentation

## 2021-05-25 DIAGNOSIS — Y9389 Activity, other specified: Secondary | ICD-10-CM | POA: Diagnosis not present

## 2021-05-25 DIAGNOSIS — T1592XA Foreign body on external eye, part unspecified, left eye, initial encounter: Secondary | ICD-10-CM | POA: Diagnosis present

## 2021-05-25 DIAGNOSIS — I10 Essential (primary) hypertension: Secondary | ICD-10-CM | POA: Insufficient documentation

## 2021-05-25 DIAGNOSIS — Y990 Civilian activity done for income or pay: Secondary | ICD-10-CM | POA: Diagnosis not present

## 2021-05-25 NOTE — ED Provider Notes (Signed)
Emergency Medicine Provider Triage Evaluation Note  Douglas Ford , a 55 y.o. male  was evaluated in triage.  Pt complains of foreign body in left eye.  He was at work and felt air and possible FB strike his left eye. Chart shows last Tdap 08/2016. [facility] from [facility]. They attempted removal at urgent care and were unsucessful, noted left eye fluorescein uptake per urgent care note.  Review of Systems  Positive: Left eye pain Negative: Fevers, vision loss  Physical Exam  BP (!) 163/101 (BP Location: Right Arm)   Pulse 66   Temp 98.2 F (36.8 C) (Oral)   Resp 16   Ht 5\' 4"  (1.626 m)   Wt 92 kg   SpO2 100%   BMI 34.81 kg/m  Gen:   Awake, no distress   Resp:  Normal effort  MSK:   Moves extremities without difficulty  Other:  Left thigh is slightly erythematous when compared to right thigh.  No obvious foreign body visualized however it appears from urgent care notes that they visualize this using a slit-lamp microscope.  His vision is grossly intact and he is able to count fingers.  Medical Decision Making  Medically screening exam initiated at 8:18 PM.  Appropriate orders placed.  Happy Ky was informed that the remainder of the evaluation will be completed by another provider, this initial triage assessment does not replace that evaluation, and the importance of remaining in the ED until their evaluation is complete.  Note: Portions of this report may have been transcribed using voice recognition software. Every effort was made to ensure accuracy; however, inadvertent computerized transcription errors may be present     Ollen Gross 05/25/21 2026    Lucrezia Starch, MD 05/25/21 2258

## 2021-05-25 NOTE — ED Triage Notes (Signed)
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

## 2021-05-26 MED ORDER — OXYCODONE-ACETAMINOPHEN 5-325 MG PO TABS
1.0000 | ORAL_TABLET | Freq: Once | ORAL | Status: AC
Start: 2021-05-26 — End: 2021-05-26
  Administered 2021-05-26: 1 via ORAL
  Filled 2021-05-26: qty 1

## 2021-05-26 MED ORDER — OFLOXACIN 0.3 % OP SOLN
1.0000 [drp] | Freq: Four times a day (QID) | OPHTHALMIC | Status: DC
  Administered 2021-05-26: 1 [drp] via OPHTHALMIC
  Filled 2021-05-26: qty 5

## 2021-05-26 MED ORDER — FLUORESCEIN SODIUM 1 MG OP STRP
1.0000 | ORAL_STRIP | Freq: Once | OPHTHALMIC | Status: AC
  Administered 2021-05-26: 1 via OPHTHALMIC
  Filled 2021-05-26: qty 1

## 2021-05-26 MED ORDER — TETRACAINE HCL 0.5 % OP SOLN
2.0000 [drp] | Freq: Once | OPHTHALMIC | Status: AC
  Administered 2021-05-26: 2 [drp] via OPHTHALMIC
  Filled 2021-05-26: qty 4

## 2021-05-26 NOTE — Discharge Instructions (Signed)
Thank you for allowing me to care for you today in the Emergency Department.   Dr. Nancy Fetter is going the number that we gave her together over the phone this morning so that she can see you in the office before your trip.  Please make sure that you keep your phone near you in with the ring or on so that you do not miss or call.  Please 1 drop of ofloxacin drops in the left eye every 6 hours for the next 5 days or longer if directed by Dr. Nancy Fetter.  Take 650 mg of Tylenol or 600 mg of ibuprofen with food every 6 hours for pain.  You can alternate between these 2 medications every 3 hours if your pain returns.  For instance, you can take Tylenol at noon, followed by a dose of ibuprofen at 3, followed by second dose of Tylenol and 6.  Return to the emergency department if you lose vision in her left eye, if you start having thick, mucus-like drainage from the eye, or other new, concerning symptoms.

## 2021-05-26 NOTE — ED Provider Notes (Signed)
[facility] EMERGENCY DEPARTMENT Provider Note   CSN: [id number] Arrival date & time: 05/25/21  1847     History Chief Complaint  Patient presents with   Foreign Body at [place] is a 55 y.o. male with a history of hypertension, obesity who presents the emergency department from urgent care with a chief complaint of foreign body in the eye.  The patient reports that he was at work using a air compressor to fill entire when he shot urine to his left only.  He also felt a small foreign body go into his left eye and has been endorsing left eye redness, tearing, and irritation throughout the day.  He attempted to flush the eye out with water with no improvement.  No other known aggravating or alleviating factors.  He was seen at urgent care and there was fluorescein uptake and he was diagnosed with a foreign body in the left eye over the cornea, but was unable to be removed at urgent care.  He reports the pain is persisted throughout the day.  The patient's is leaving the country to go visit his family in [place] for the second time in the last 23 years on Monday, and is concerned about foreign body removal before he goes out of the country.  He denies fever, chills, redness or swelling to the face, purulent discharge, diplopia, amaurosis fugax, or blurry vision.  The history is provided by medical records and the patient. No language interpreter was used.      Past Medical History:  Diagnosis Date   Hypertension    Lipoma of back 08/2014   ultrasound-confirmed    Patient Active Problem List   Diagnosis Date Noted   Obesity (BMI 30.0-34.9) 12/20/2017   GERD (gastroesophageal reflux disease) 09/03/2016   Headache 09/03/2016   HTN (hypertension), benign 08/30/2014   Subcutaneous mass 08/30/2014    Past Surgical History:  Procedure Laterality Date   LAPAROSCOPIC CHOLECYSTECTOMY  2005   TONSILECTOMY, ADENOIDECTOMY, BILATERAL MYRINGOTOMY AND TUBES          Family History  Problem Relation Age of Onset   Breast cancer Mother        mets   Other Father        poor communication   Breast cancer Sister    Non-Hodgkin's lymphoma Brother    Colon cancer Neg Hx     Social History   Tobacco Use   Smoking status: Never   Smokeless tobacco: Never  Substance Use Topics   Alcohol use: Yes    Comment: social   Drug use: No    Home Medications Prior to Admission medications   Medication Sig Start Date End Date Taking? Authorizing Provider  acetaminophen (TYLENOL) 325 MG tablet Take 650 mg by mouth every 6 (six) hours as needed for moderate pain or headache.   Yes [provider]  amLODipine (NORVASC) 5 MG tablet Take 1 tablet by mouth once daily Patient taking differently: Take 5 mg by mouth daily. 04/16/21  Yes Marin Olp, MD  lisinopril-hydrochlorothiazide (ZESTORETIC) 20-25 MG tablet Take 1 tablet by mouth once daily Patient taking differently: Take 1 tablet by mouth daily. 04/16/21  Yes Marin Olp, MD    Allergies    Patient has no known allergies.  Review of Systems   Review of Systems  Constitutional:  Negative for activity change, chills and fever.  HENT:  Negative for congestion and sore throat.   Eyes:  Positive for pain, discharge and redness. Negative for photophobia, itching and visual disturbance.  Respiratory:  Negative for shortness of breath.   Cardiovascular:  Negative for chest pain.  Gastrointestinal:  Negative for abdominal pain, diarrhea, nausea and vomiting.  Musculoskeletal:  Negative for back pain.  Skin:  Negative for rash.  Allergic/Immunologic: Negative for immunocompromised state.  Neurological:  Negative for seizures, syncope, weakness, numbness and headaches.   Physical Exam Updated Vital Signs BP (!) 154/108 (BP Location: Right Arm)   Pulse 60   Temp 98 F (36.7 C) (Oral)   Resp 16   Ht 5\' 4"  (1.626 m)   Wt 92 kg   SpO2 97%   BMI 34.81 kg/m   Physical  Exam Vitals and nursing note reviewed.  Constitutional:      General: He is not in acute distress.    Appearance: He is well-developed. He is not ill-appearing, toxic-appearing or diaphoretic.  HENT:     Head: Normocephalic.  Eyes:     General: Lids are normal. Vision grossly intact.        Right eye: No foreign body.        Left eye: Foreign body present.    Conjunctiva/sclera: Conjunctivae normal.     Comments: Left eye is injected.  There is a very small foreign body noted to the left cornea.  No rust ring is noted.  Tearing noted to the left eye.  No periorbital edema or erythema.  Cardiovascular:     Rate and Rhythm: Normal rate and regular rhythm.     Heart sounds: No murmur heard. Pulmonary:     Effort: Pulmonary effort is normal.  Abdominal:     General: There is no distension.     Palpations: Abdomen is soft.  Musculoskeletal:     Cervical back: Neck supple.  Skin:    General: Skin is warm and dry.  Neurological:     Mental Status: He is alert.  Psychiatric:        Behavior: Behavior normal.    ED Results / Procedures / Treatments   Labs (all labs ordered are listed, but only abnormal results are displayed) Labs Reviewed - No data to display  EKG None  Radiology No results found.  Procedures .Foreign Body Removal  Date/Time: 05/26/2021 7:06 AM Performed by: Joanne Gavel, PA-C Authorized by: Joanne Gavel, PA-C  Consent: Verbal consent not obtained. Written consent not obtained. Risks and benefits: risks, benefits and alternatives were discussed (I have personally discussed the risk and benefits of the procedure with the patient.  He verbally gave agreement to the procedure.) Consent given by: patient Patient understanding: patient does not state understanding of the procedure being performed Patient consent: the patient's understanding of the procedure does not match consent given Patient identity confirmed: verbally with patient, arm band and  provided demographic data Body area: eye Location details: left cornea Anesthesia: see MAR for details  Anesthesia: Local Anesthetic: tetracaine drops  Sedation: Patient sedated: no  Patient restrained: no Patient cooperative: yes Localization method: visualized Eye examined with fluorescein. Fluorescein uptake. Corneal abrasion size: small Corneal abrasion location: central No residual rust ring present. Dressing: antibiotic drops Complexity: complex Number of foreign bodies recovered: Partial removal of small, black foreign body.  There is less than 10% of the entire foreign body remaining at the end of the procedure. Objects recovered: Black, nonmetal substance as I did not see arrest during Post-procedure assessment: residual foreign bodies remain Patient tolerance: patient tolerated the  procedure well with no immediate complications    Medications Ordered in ED Medications  ofloxacin (OCUFLOX) 0.3 % ophthalmic solution 1 drop (1 drop Left Eye Given 05/26/21 0540)  fluorescein ophthalmic strip 1 strip (1 strip Both Eyes Given 05/26/21 0226)  tetracaine (PONTOCAINE) 0.5 % ophthalmic solution 2 drop (2 drops Both Eyes Given 05/26/21 0226)  oxyCODONE-acetaminophen (PERCOCET/ROXICET) 5-325 MG per tablet 1 tablet (1 tablet Oral Given 05/26/21 1601)    ED Course  I have reviewed the triage vital signs and the nursing notes.  Pertinent labs & imaging results that were available during my care of the patient were reviewed by me and considered in my medical decision making (see chart for details).    MDM Rules/Calculators/A&P                           55 year old male presenting with left eye injury and pain from urgent care.  He was using a an air compressor at work when a puff of air him in the left eye as well as a foreign body.  He was seen at urgent care and had fluorescein uptake, but they were unable to remove the foreign body.  Vital signs are stable.  On exam,  foreign bodies visualized.  Fluorescein stain with small fluorescein uptake.  You will need antibiotics for corneal abrasion.  Shared decision-making conversation with the patient regarding foreign body removal.  Initially attempted moistened cotton swabs without success.  Patient then underwent copious irrigation with normal saline without removal.  I then attempted to use the tip of the needle with saline syringe to gently trying to remove the foreign body from the cornea.  After approximately 3-4 attempts, approximately 90% of the foreign body was removed.  After multiple attempts, I did not feel comfortable continuing to try to remove the residual foreign body.  The patient was started on ofloxacin, first dose given in the ED.  He reports some improvement following the procedure, but continues to have foreign body sensation.  He was given Percocet in the ED for pain control.  Spoke with Dr. Nancy Fetter, ophthalmology, who will call the patient directly to have the patient follow-up since he is leaving the country for several weeks in 2 days.  I confirmed the patient's phone number verbally with the patient while Dr. Nancy Fetter was on the phone.  All questions answered and patient is agreeable with this plan.  ER return precautions given.  He is hemodynamically stable in no acute distress.  Safer discharge home with outpatient follow-up as discussed.  Final Clinical Impression(s) / ED Diagnoses Final diagnoses:  Foreign body of left eye, initial encounter    Rx / DC Orders ED Discharge Orders     None        Tranesha Lessner A, PA-C 05/26/21 0932    Veryl Speak, MD 05/27/21 [phone]

## 2021-05-26 NOTE — ED Notes (Signed)
Left eye irrigated with 500cc ns using Morgan Lens.

## 2021-06-14 ENCOUNTER — Telehealth: Payer: Self-pay

## 2021-06-14 NOTE — Telephone Encounter (Signed)
Outbound call made to offer 2nd MPX vaccine. Left HIPPA compliant voicemail to return office call for scheduling. Douglas Ford

## 2021-06-16 ENCOUNTER — Other Ambulatory Visit: Payer: Self-pay | Admitting: Family Medicine

## 2021-07-14 ENCOUNTER — Other Ambulatory Visit: Payer: Self-pay | Admitting: Family Medicine

## 2021-08-13 ENCOUNTER — Other Ambulatory Visit: Payer: Self-pay | Admitting: Family Medicine

## 2021-09-27 ENCOUNTER — Other Ambulatory Visit: Payer: Self-pay | Admitting: Family Medicine

## 2021-10-27 ENCOUNTER — Other Ambulatory Visit: Payer: Self-pay | Admitting: Family Medicine

## 2022-04-29 ENCOUNTER — Encounter: Payer: Self-pay | Admitting: *Deleted

## 2022-05-05 ENCOUNTER — Other Ambulatory Visit: Payer: Self-pay | Admitting: Family Medicine

## 2022-05-14 ENCOUNTER — Other Ambulatory Visit: Payer: Self-pay | Admitting: Family Medicine

## 2022-05-17 ENCOUNTER — Ambulatory Visit (INDEPENDENT_AMBULATORY_CARE_PROVIDER_SITE_OTHER): Payer: Self-pay | Admitting: Family Medicine

## 2022-05-17 ENCOUNTER — Other Ambulatory Visit: Payer: Self-pay | Admitting: Family Medicine

## 2022-05-17 ENCOUNTER — Telehealth: Payer: Self-pay | Admitting: Family Medicine

## 2022-05-17 ENCOUNTER — Encounter: Payer: Self-pay | Admitting: Family Medicine

## 2022-05-17 VITALS — BP 140/96 | HR 70 | Temp 98.2°F | Ht 64.0 in | Wt 186.2 lb

## 2022-05-17 DIAGNOSIS — I10 Essential (primary) hypertension: Secondary | ICD-10-CM

## 2022-05-17 DIAGNOSIS — Z125 Encounter for screening for malignant neoplasm of prostate: Secondary | ICD-10-CM

## 2022-05-17 MED ORDER — LISINOPRIL-HYDROCHLOROTHIAZIDE 20-25 MG PO TABS: 1.0000 | ORAL_TABLET | Freq: Every day | ORAL | 3 refills | Status: DC

## 2022-05-17 MED ORDER — AMLODIPINE BESYLATE 5 MG PO TABS: 5.0000 mg | ORAL_TABLET | Freq: Every day | ORAL | 3 refills | Status: DC

## 2022-05-17 NOTE — Progress Notes (Signed)
Phone [phone] In person visit   Subjective:   Douglas Ford is a 56 y.o. year old very pleasant male patient who presents for/with See problem oriented charting Chief Complaint  Patient presents with   Medication Refill    Pt request refills amlodipine and lisinopril    Past Medical History-  Patient Active Problem List   Diagnosis Date Noted   GERD (gastroesophageal reflux disease) 09/03/2016    Priority: Medium    HTN (hypertension), benign 08/30/2014    Priority: Medium    Obesity (BMI 30.0-34.9) 12/20/2017    Priority: Low   Headache 09/03/2016    Priority: Low   Subcutaneous mass 08/30/2014    Priority: Low    Medications- reviewed and updated Current Outpatient Medications  Medication Sig Dispense Refill   acetaminophen (TYLENOL) 325 MG tablet Take 650 mg by mouth every 6 (six) hours as needed for moderate pain or headache.     amLODipine (NORVASC) 5 MG tablet Take 1 tablet (5 mg total) by mouth daily. 90 tablet 3   lisinopril-hydrochlorothiazide (ZESTORETIC) 20-25 MG tablet Take 1 tablet by mouth daily. 90 tablet 3   No current facility-administered medications for this visit.     Objective:  BP (!) 140/96   Pulse 70   Temp 98.2 F (36.8 C)   Ht '5\' 4"'$  (1.626 m)   Wt 186 lb 3.2 oz (84.5 kg)   SpO2 97%   BMI 31.96 kg/m  Gen: NAD, resting comfortably CV: RRR no murmurs rubs or gallops Lungs: CTAB no crackles, wheeze, rhonchi Ext: no edema Skin: warm, dry     Assessment and Plan   #hypertension S: medication: has run out of amlodipine 5 mg and lisinopril hctz 20-25 mg- ran out 2 weeks ago Home readings #s: over 150 when recently checked- checked through work sounds like -no chest pain or shortness of breath- some mild headaches BP Readings from Last 3 Encounters:  05/17/22 (!) 140/96  05/26/21 (!) 154/108  04/16/21 110/78  A/P: Blood pressure poorly controlled off medication-we will restart amlodipine 5 mg and lisinopril hctz 20-25 mg each  daily and asked him to either see me in 2-3 weeks or update me with blood pressure readings on mychart -consider buying new cuff if you are concerned about your old cuff - lipids controlled last year- will hold off on repeat but check cbc and cmp and add urinalysis as initial plan- later realized does not have insurance and concerned about cost- we will wait for 6 month physical when he thinks he will have insurance  #screening prostate cancer- was going to trend psa- but see above- likely next visit Lab Results  Component Value Date   PSA 1.25 04/16/2021   Recommended follow up: Return in about 6 months (around 11/16/2022) for physical or sooner if needed.Schedule b4 you leave.  Lab/Order associations:   ICD-10-CM   1. HTN (hypertension), benign  I10 CANCELED: CBC with Differential/Platelet    CANCELED: Comprehensive metabolic panel    CANCELED: Urinalysis, Routine w reflex microscopic    2. Screening for prostate cancer  Z12.5 CANCELED: PSA      Meds ordered this encounter  Medications   amLODipine (NORVASC) 5 MG tablet    Sig: Take 1 tablet (5 mg total) by mouth daily.    Dispense:  90 tablet    Refill:  3   lisinopril-hydrochlorothiazide (ZESTORETIC) 20-25 MG tablet    Sig: Take 1 tablet by mouth daily.    Dispense:  90 tablet  Refill:  3    Return precautions advised.  Garret Reddish, MD

## 2022-05-17 NOTE — Patient Instructions (Addendum)
Blood pressure poorly controlled off medication-we will restart amlodipine 5 mg and lisinopril hctz 20-25 mg each daily and asked him to either see me in 2-3 weeks or update me with blood pressure readings on mychart -consider buying new cuff if you are concerned about your old cuff  Recommended follow up: Return in about 6 months (around 11/16/2022) for physical or sooner if needed.Schedule b4 you leave.

## 2022-05-17 NOTE — Telephone Encounter (Signed)
Error

## 2022-05-17 NOTE — Telephone Encounter (Signed)
Caller is Aniyah from [employer] states: -She is calling on behalf of patient for med refill since mychart message was never replied to. - Patient has gone 2 weeks without medication.   I found patient hasn't been seen in office in over a year. Due to this I was able to get patient scheduled for 05/17/22 @ 3:20pm for med refill.

## 2023-02-28 ENCOUNTER — Encounter: Payer: Self-pay | Admitting: Physician Assistant

## 2023-02-28 ENCOUNTER — Ambulatory Visit (INDEPENDENT_AMBULATORY_CARE_PROVIDER_SITE_OTHER): Payer: Self-pay | Admitting: Physician Assistant

## 2023-02-28 ENCOUNTER — Other Ambulatory Visit (HOSPITAL_COMMUNITY)
Admission: RE | Admit: 2023-02-28 | Discharge: 2023-02-28 | Disposition: A | Discharge status: Home / Self Care | Payer: Self-pay | Source: Ambulatory Visit | Attending: Physician Assistant | Admitting: Physician Assistant

## 2023-02-28 VITALS — BP 122/80 | HR 101 | Temp 101.7°F | Ht 64.0 in | Wt 183.8 lb

## 2023-02-28 DIAGNOSIS — R300 Dysuria: Secondary | ICD-10-CM

## 2023-02-28 DIAGNOSIS — R6883 Chills (without fever): Secondary | ICD-10-CM

## 2023-02-28 DIAGNOSIS — N410 Acute prostatitis: Secondary | ICD-10-CM

## 2023-02-28 DIAGNOSIS — R52 Pain, unspecified: Secondary | ICD-10-CM

## 2023-02-28 LAB — POC URINALSYSI DIPSTICK (AUTOMATED)
Bilirubin, UA: NEGATIVE
Blood, UA: POSITIVE — AB
Glucose, UA: NEGATIVE
Ketones, UA: NEGATIVE
Leukocytes, UA: NEGATIVE
Nitrite, UA: NEGATIVE
Protein, UA: POSITIVE — AB
Spec Grav, UA: 1.025 (ref 1.010–1.025)
Urobilinogen, UA: 1 E.U./dL
pH, UA: 6 (ref 5.0–8.0)

## 2023-02-28 LAB — POC COVID19 BINAXNOW: SARS Coronavirus 2 Ag: NEGATIVE

## 2023-02-28 MED ORDER — SULFAMETHOXAZOLE-TRIMETHOPRIM 800-160 MG PO TABS: 1.0000 | ORAL_TABLET | Freq: Two times a day (BID) | ORAL | 0 refills | Status: DC

## 2023-02-28 MED ORDER — CEFTRIAXONE SODIUM 1 G IJ SOLR
1.0000 g | Freq: Once | INTRAMUSCULAR | Status: AC
Start: 2023-02-28 — End: 2023-02-28
  Administered 2023-02-28: 1 g via INTRAMUSCULAR

## 2023-02-28 NOTE — Progress Notes (Unsigned)
   Subjective:    Patient ID: Douglas Ford, male    DOB: 1966-05-25, 57 y.o.   MRN: [id number]  Chief Complaint  Patient presents with   Chills    Pt stated that he is having some chills and body ache which also stated last night    hard to urinate    Pt stated that he has been having trouble trying to pee which started last night    HPI Patient is in today for chills, body aches, headache, dysuria. Here with husband, [name].  About 30 minutes ago he was able to urinate, but less than normal, says more than drops.  Started with chills after dinner last night. Up most of the night with frequent urination (small amounts at a time).   Nausea somewhat, no vomiting. No diarrhea.   Back and abdomen are both hurting.   No hx of kidney stones or urinary infections.   Has not had any medications so far.    Past Medical History:  Diagnosis Date   Hypertension    Lipoma of back 08/2014   ultrasound-confirmed    Past Surgical History:  Procedure Laterality Date   LAPAROSCOPIC CHOLECYSTECTOMY  2005   TONSILECTOMY, ADENOIDECTOMY, BILATERAL MYRINGOTOMY AND TUBES      Family History  Problem Relation Age of Onset   Breast cancer Mother        mets   Other Father        poor communication   Breast cancer Sister    Non-Hodgkin's lymphoma Brother    Colon cancer Neg Hx     Social History   Tobacco Use   Smoking status: Never   Smokeless tobacco: Never  Substance Use Topics   Alcohol use: Yes    Comment: social   Drug use: No     No Known Allergies  Review of Systems NEGATIVE UNLESS OTHERWISE INDICATED IN HPI      Objective:     BP 122/80   Pulse (!) 101   Temp (!) 103.1 F (39.5 C) (Oral)   Ht 5\' 4"  (1.626 m)   Wt 183 lb 12.8 oz (83.4 kg)   SpO2 95%   BMI 31.55 kg/m   Wt Readings from Last 3 Encounters:  02/28/23 183 lb 12.8 oz (83.4 kg)  05/17/22 186 lb 3.2 oz (84.5 kg)  05/25/21 202 lb 13.2 oz (92 kg)    BP Readings from Last 3 Encounters:   02/28/23 122/80  05/17/22 (!) 140/96  05/26/21 (!) 154/108     Physical Exam     Assessment & Plan:  Chills -     POC COVID-19 BinaxNow -     POCT Urinalysis Dipstick (Automated) -     Urine Culture  Body aches -     POC COVID-19 BinaxNow  Dysuria -     POCT Urinalysis Dipstick (Automated) -     Urine Culture        No follow-ups on file.  This note was prepared with assistance of Conservation officer, historic buildings. Occasional wrong-word or sound-a-like substitutions may have occurred due to the inherent limitations of voice recognition software.  Time Spent: *** minutes of total time was spent on the date of the encounter performing the following actions: chart review prior to seeing the patient, obtaining history, performing a medically necessary exam, counseling on the treatment plan, placing orders, and documenting in our EHR.       Ritha Sampedro M Amron Guerrette, PA-C

## 2023-03-03 ENCOUNTER — Inpatient Hospital Stay (HOSPITAL_BASED_OUTPATIENT_CLINIC_OR_DEPARTMENT_OTHER)
Admission: EM | Admit: 2023-03-03 | Discharge: 2023-03-07 | DRG: 690 | Disposition: A | Discharge status: Home Health | Payer: Self-pay | Attending: Internal Medicine | Admitting: Internal Medicine

## 2023-03-03 ENCOUNTER — Encounter (HOSPITAL_BASED_OUTPATIENT_CLINIC_OR_DEPARTMENT_OTHER): Payer: Self-pay

## 2023-03-03 ENCOUNTER — Emergency Department (HOSPITAL_BASED_OUTPATIENT_CLINIC_OR_DEPARTMENT_OTHER): Payer: Self-pay | Admitting: Radiology

## 2023-03-03 ENCOUNTER — Emergency Department (HOSPITAL_BASED_OUTPATIENT_CLINIC_OR_DEPARTMENT_OTHER): Payer: Self-pay

## 2023-03-03 ENCOUNTER — Other Ambulatory Visit: Payer: Self-pay | Admitting: Physician Assistant

## 2023-03-03 ENCOUNTER — Telehealth: Payer: Self-pay | Admitting: Family Medicine

## 2023-03-03 ENCOUNTER — Other Ambulatory Visit: Payer: Self-pay

## 2023-03-03 DIAGNOSIS — N419 Inflammatory disease of prostate, unspecified: Secondary | ICD-10-CM | POA: Diagnosis present

## 2023-03-03 DIAGNOSIS — Z79899 Other long term (current) drug therapy: Secondary | ICD-10-CM

## 2023-03-03 DIAGNOSIS — N410 Acute prostatitis: Secondary | ICD-10-CM | POA: Diagnosis present

## 2023-03-03 DIAGNOSIS — Z807 Family history of other malignant neoplasms of lymphoid, hematopoietic and related tissues: Secondary | ICD-10-CM

## 2023-03-03 DIAGNOSIS — Z6832 Body mass index (BMI) 32.0-32.9, adult: Secondary | ICD-10-CM

## 2023-03-03 DIAGNOSIS — Z1612 Extended spectrum beta lactamase (ESBL) resistance: Secondary | ICD-10-CM | POA: Diagnosis present

## 2023-03-03 DIAGNOSIS — E871 Hypo-osmolality and hyponatremia: Secondary | ICD-10-CM | POA: Diagnosis present

## 2023-03-03 DIAGNOSIS — N12 Tubulo-interstitial nephritis, not specified as acute or chronic: Principal | ICD-10-CM

## 2023-03-03 DIAGNOSIS — Z803 Family history of malignant neoplasm of breast: Secondary | ICD-10-CM

## 2023-03-03 DIAGNOSIS — K449 Diaphragmatic hernia without obstruction or gangrene: Secondary | ICD-10-CM | POA: Diagnosis present

## 2023-03-03 DIAGNOSIS — E876 Hypokalemia: Secondary | ICD-10-CM | POA: Diagnosis present

## 2023-03-03 DIAGNOSIS — I10 Essential (primary) hypertension: Secondary | ICD-10-CM | POA: Diagnosis present

## 2023-03-03 DIAGNOSIS — N10 Acute tubulo-interstitial nephritis: Principal | ICD-10-CM | POA: Diagnosis present

## 2023-03-03 DIAGNOSIS — B9620 Unspecified Escherichia coli [E. coli] as the cause of diseases classified elsewhere: Secondary | ICD-10-CM | POA: Diagnosis present

## 2023-03-03 DIAGNOSIS — R519 Headache, unspecified: Secondary | ICD-10-CM | POA: Diagnosis present

## 2023-03-03 DIAGNOSIS — K760 Fatty (change of) liver, not elsewhere classified: Secondary | ICD-10-CM | POA: Diagnosis present

## 2023-03-03 DIAGNOSIS — B9629 Other Escherichia coli [E. coli] as the cause of diseases classified elsewhere: Secondary | ICD-10-CM | POA: Diagnosis present

## 2023-03-03 DIAGNOSIS — R5381 Other malaise: Secondary | ICD-10-CM | POA: Diagnosis present

## 2023-03-03 DIAGNOSIS — R739 Hyperglycemia, unspecified: Secondary | ICD-10-CM | POA: Diagnosis present

## 2023-03-03 DIAGNOSIS — Z9049 Acquired absence of other specified parts of digestive tract: Secondary | ICD-10-CM

## 2023-03-03 DIAGNOSIS — E669 Obesity, unspecified: Secondary | ICD-10-CM | POA: Diagnosis present

## 2023-03-03 DIAGNOSIS — Z1152 Encounter for screening for COVID-19: Secondary | ICD-10-CM

## 2023-03-03 DIAGNOSIS — N390 Urinary tract infection, site not specified: Secondary | ICD-10-CM | POA: Diagnosis present

## 2023-03-03 LAB — RESP PANEL BY RT-PCR (RSV, FLU A&B, COVID)  RVPGX2
Influenza A by PCR: NEGATIVE
Influenza B by PCR: NEGATIVE
Resp Syncytial Virus by PCR: NEGATIVE
SARS Coronavirus 2 by RT PCR: NEGATIVE

## 2023-03-03 LAB — COMPREHENSIVE METABOLIC PANEL
ALT: 47 U/L — ABNORMAL HIGH (ref 0–44)
AST: 41 U/L (ref 15–41)
Albumin: 4.2 g/dL (ref 3.5–5.0)
Alkaline Phosphatase: 46 U/L (ref 38–126)
Anion gap: 11 (ref 5–15)
BUN: 13 mg/dL (ref 6–20)
CO2: 25 mmol/L (ref 22–32)
Calcium: 9.3 mg/dL (ref 8.9–10.3)
Chloride: 94 mmol/L — ABNORMAL LOW (ref 98–111)
Creatinine, Ser: 1.01 mg/dL (ref 0.61–1.24)
GFR, Estimated: >60 mL/min (ref >60)
Glucose, Bld: 148 mg/dL — ABNORMAL HIGH (ref 70–99)
Potassium: 3 mmol/L — ABNORMAL LOW (ref 3.5–5.1)
Sodium: 130 mmol/L — ABNORMAL LOW (ref 135–145)
Total Bilirubin: 0.6 mg/dL (ref 0.3–1.2)
Total Protein: 7.9 g/dL (ref 6.5–8.1)

## 2023-03-03 LAB — URINALYSIS, ROUTINE W REFLEX MICROSCOPIC
Bacteria, UA: NONE SEEN
Bilirubin Urine: NEGATIVE
Glucose, UA: NEGATIVE mg/dL
Ketones, ur: NEGATIVE mg/dL
Leukocytes,Ua: NEGATIVE
Nitrite: NEGATIVE
Specific Gravity, Urine: 1.023 (ref 1.005–1.030)
pH: 6 (ref 5.0–8.0)

## 2023-03-03 LAB — CBC
HCT: 49.5 % (ref 39.0–52.0)
Hemoglobin: 18.2 g/dL — ABNORMAL HIGH (ref 13.0–17.0)
MCH: 31.1 pg (ref 26.0–34.0)
MCHC: 36.8 g/dL — ABNORMAL HIGH (ref 30.0–36.0)
MCV: 84.5 fL (ref 80.0–100.0)
Platelets: 168 10*3/uL (ref 150–400)
RBC: 5.86 MIL/uL — ABNORMAL HIGH (ref 4.22–5.81)
RDW: 12.2 % (ref 11.5–15.5)
WBC: 4.3 10*3/uL (ref 4.0–10.5)
nRBC: 0 % (ref 0.0–0.2)

## 2023-03-03 LAB — LACTIC ACID, PLASMA: Lactic Acid, Venous: 1.2 mmol/L (ref 0.5–1.9)

## 2023-03-03 LAB — LIPASE, BLOOD: Lipase: 20 U/L (ref 11–51)

## 2023-03-03 MED ORDER — AMOXICILLIN-POT CLAVULANATE 875-125 MG PO TABS: 1.0000 | ORAL_TABLET | Freq: Two times a day (BID) | ORAL | 0 refills | Status: DC

## 2023-03-03 MED ORDER — MORPHINE SULFATE (PF) 4 MG/ML IV SOLN
4.0000 mg | Freq: Once | INTRAVENOUS | Status: AC
  Administered 2023-03-03: 4 mg via INTRAVENOUS
  Filled 2023-03-03: qty 1

## 2023-03-03 MED ORDER — SODIUM CHLORIDE 0.9 % IV BOLUS
1000.0000 mL | Freq: Once | INTRAVENOUS | Status: AC
  Administered 2023-03-03: 1000 mL via INTRAVENOUS

## 2023-03-03 MED ORDER — ONDANSETRON HCL 4 MG/2ML IJ SOLN: 4.0000 mg | Freq: Four times a day (QID) | INTRAMUSCULAR | Status: DC | PRN

## 2023-03-03 MED ORDER — ONDANSETRON HCL 4 MG/2ML IJ SOLN
4.0000 mg | Freq: Once | INTRAMUSCULAR | Status: AC
  Administered 2023-03-03: 4 mg via INTRAVENOUS
  Filled 2023-03-03: qty 2

## 2023-03-03 MED ORDER — POTASSIUM CHLORIDE CRYS ER 20 MEQ PO TBCR
40.0000 meq | EXTENDED_RELEASE_TABLET | Freq: Once | ORAL | Status: AC
  Administered 2023-03-03: 40 meq via ORAL
  Filled 2023-03-03: qty 2

## 2023-03-03 MED ORDER — ACETAMINOPHEN 500 MG PO TABS
1000.0000 mg | ORAL_TABLET | Freq: Once | ORAL | Status: AC
  Administered 2023-03-03: 1000 mg via ORAL
  Filled 2023-03-03: qty 2

## 2023-03-03 MED ORDER — VANCOMYCIN HCL 125 MG PO CAPS: 125.0000 mg | ORAL_CAPSULE | Freq: Four times a day (QID) | ORAL | Status: DC

## 2023-03-03 MED ORDER — SODIUM CHLORIDE 0.9 % IV SOLN
1.0000 g | Freq: Three times a day (TID) | INTRAVENOUS | Status: DC
  Administered 2023-03-03 – 2023-03-06 (×9): 1 g via INTRAVENOUS
  Filled 2023-03-03 (×7): qty 20

## 2023-03-03 MED ORDER — AMLODIPINE BESYLATE 5 MG PO TABS
5.0000 mg | ORAL_TABLET | Freq: Every day | ORAL | Status: DC
  Administered 2023-03-04 – 2023-03-07 (×4): 5 mg via ORAL
  Filled 2023-03-03 (×4): qty 1

## 2023-03-03 MED ORDER — MORPHINE SULFATE (PF) 2 MG/ML IV SOLN: 2.0000 mg | INTRAVENOUS | Status: DC | PRN

## 2023-03-03 NOTE — Telephone Encounter (Signed)
FYI

## 2023-03-03 NOTE — Telephone Encounter (Signed)
Patient's partner [name] states he is taking Patient  to [facility] ED due to symptoms have not improved

## 2023-03-03 NOTE — ED Notes (Signed)
Blood cultures drawn via iv starts in R and L arm.

## 2023-03-03 NOTE — ED Triage Notes (Signed)
Thursday evening had chills after dinner, saw PA Friday morning, got a shot of Abx and prescription. Some improvement but now symptoms return.Denies SOB, occasional cough.

## 2023-03-03 NOTE — ED Provider Notes (Signed)
[place] EMERGENCY DEPARTMENT AT [facility] Provider Note   CSN: [id number] Arrival date & time: 03/03/23  1353     History  Chief Complaint  Patient presents with   Fever    Douglas Ford is a 57 y.o. male with a past medical history of hypertension who presents emergency department with concerns for fever onset 4 days.  Notes he initially had chills 5 days ago.  Was evaluated by his primary care provider 4 days ago.  At that time there was concerns for acute prostatitis versus pyelonephritis.  Patient was started on Bactrim.  Has associated headache on the left side, dribbling with urination (history of similar symptoms 5 years ago, does not have a urologist at this time), bilateral flank pain, nausea.  Has been alternating Tylenol and Motrin at home.  Denies dysuria, hematuria, dysuria, hematuria, abdominal pain, diarrhea, constipation, vomiting   Per pt chart review: Patient was evaluated by his primary care provider on 02/28/2023.  At that time was diagnosed with acute prostatitis.  Was prescribed Bactrim.  The history is provided by the patient. No language interpreter was used.       Home Medications Prior to Admission medications   Medication Sig Start Date End Date Taking? Authorizing Provider  acetaminophen (TYLENOL) 325 MG tablet Take 650 mg by mouth every 6 (six) hours as needed for moderate pain or headache.    [provider]  amLODipine (NORVASC) 5 MG tablet Take 1 tablet (5 mg total) by mouth daily. 05/17/22   Shelva Majestic, MD  amoxicillin-clavulanate (AUGMENTIN) 875-125 MG tablet Take 1 tablet by mouth 2 (two) times daily. Take with food. 03/03/23   Allwardt, Crist Infante, PA-C  lisinopril-hydrochlorothiazide (ZESTORETIC) 20-25 MG tablet Take 1 tablet by mouth daily. 05/17/22   Shelva Majestic, MD  sulfamethoxazole-trimethoprim (BACTRIM DS) 800-160 MG tablet Take 1 tablet by mouth 2 (two) times daily. 02/28/23 04/11/23  Allwardt, Crist Infante, PA-C       Allergies    Patient has no known allergies.    Review of Systems   Review of Systems  Constitutional:  Positive for fever.  Gastrointestinal:  Positive for nausea. Negative for abdominal pain, constipation, diarrhea and vomiting.  Genitourinary:  Positive for decreased urine volume and flank pain (bilateral). Negative for dysuria and hematuria.  All other systems reviewed and are negative.   Physical Exam Updated Vital Signs BP (!) 136/105 (BP Location: Right Arm)   Pulse 99   Temp 99.5 F (37.5 C) (Oral)   Resp 18   Ht 5\' 4"  (1.626 m)   Wt 83.4 kg   SpO2 95%   BMI 31.56 kg/m  Physical Exam Vitals and nursing note reviewed. Exam conducted with a chaperone present.  Constitutional:      General: He is not in acute distress.    Appearance: Normal appearance.  Eyes:     General: No scleral icterus.    Extraocular Movements: Extraocular movements intact.  Cardiovascular:     Rate and Rhythm: Normal rate.  Pulmonary:     Effort: Pulmonary effort is normal. No respiratory distress.  Abdominal:     Palpations: Abdomen is soft. There is no mass.     Tenderness: There is no abdominal tenderness.     Comments: TTP noted to right CVA  Genitourinary:    Prostate: Enlarged and tender.     Rectum: Normal. No tenderness, anal fissure, external hemorrhoid or internal hemorrhoid. Normal anal tone.     Comments: IT consultant (  Ocie Bob) present for rectal exam.  Enlarged tender prostate noted on exam. No appreciable external hemorrhoid, internal hemorrhoid, mass, anal fissure, abnormal anal tone.  No gross blood noted to rectal vault. Musculoskeletal:        General: Normal range of motion.     Cervical back: Neck supple.  Skin:    General: Skin is warm and dry.     Findings: No rash.  Neurological:     Mental Status: He is alert.     Sensory: Sensation is intact.     Motor: Motor function is intact.  Psychiatric:        Behavior: Behavior normal.     ED Results /  Procedures / Treatments   Labs (all labs ordered are listed, but only abnormal results are displayed) Labs Reviewed  COMPREHENSIVE METABOLIC PANEL - Abnormal; Notable for the following components:      Result Value   Sodium 130 (*)    Potassium 3.0 (*)    Chloride 94 (*)    Glucose, Bld 148 (*)    ALT 47 (*)    All other components within normal limits  CBC - Abnormal; Notable for the following components:   RBC 5.86 (*)    Hemoglobin 18.2 (*)    MCHC 36.8 (*)    All other components within normal limits  URINALYSIS, ROUTINE W REFLEX MICROSCOPIC - Abnormal; Notable for the following components:   Hgb urine dipstick MODERATE (*)    Protein, ur TRACE (*)    All other components within normal limits  RESP PANEL BY RT-PCR (RSV, FLU A&B, COVID)  RVPGX2  CULTURE, BLOOD (ROUTINE X 2)  CULTURE, BLOOD (ROUTINE X 2)  LIPASE, BLOOD  LACTIC ACID, PLASMA  LACTIC ACID, PLASMA    EKG None  Radiology DG Chest 2 View  Result Date: 03/03/2023 CLINICAL DATA:  Chills and occasional cough. EXAM: CHEST - 2 VIEW COMPARISON:  June 04, 2004 FINDINGS: The heart size and mediastinal contours are within normal limits. Low lung volumes are noted with mild elevation of the right hemidiaphragm. Very mild right basilar atelectasis and/or infiltrate is seen. No pleural effusion or pneumothorax is identified. Radiopaque surgical clips are present within the right upper quadrant. The visualized skeletal structures are unremarkable. IMPRESSION: Low lung volumes with very mild right basilar atelectasis and/or infiltrate. Electronically Signed   By: Aram Candela M.D.   On: 03/03/2023 15:28    Procedures Procedures    Medications Ordered in ED Medications  meropenem (MERREM) 1 g in sodium chloride 0.9 % 100 mL IVPB (1 g Intravenous New Bag/Given 03/03/23 1824)  acetaminophen (TYLENOL) tablet 1,000 mg (1,000 mg Oral Given 03/03/23 1757)  morphine (PF) 4 MG/ML injection 4 mg (4 mg Intravenous Given 03/03/23  1820)  ondansetron (ZOFRAN) injection 4 mg (4 mg Intravenous Given 03/03/23 1819)  sodium chloride 0.9 % bolus 1,000 mL (1,000 mLs Intravenous New Bag/Given 03/03/23 1817)  potassium chloride SA (KLOR-CON M) CR tablet 40 mEq (40 mEq Oral Given 03/03/23 1822)    ED Course/ Medical Decision Making/ A&P                             Medical Decision Making Amount and/or Complexity of Data Reviewed Labs: ordered. Radiology: ordered.  Risk OTC drugs. Prescription drug management.   Patient presents to the emergency department with concerns for fever x 4 days.  Patient afebrile here.  On exam patient with tenderness to  palpation noted to right CVA.  No appreciable abdominal tenderness to palpation.  RN chaperone [facility]) present for rectal exam.  Enlarged tender prostate noted on exam. No appreciable external hemorrhoid, internal hemorrhoid, mass, anal fissure, abnormal anal tone.  No gross blood noted to rectal vault. Differential diagnosis includes pyelonephritis, nephrolithiasis, prostatitis, acute cystitis.  Additional history obtained:  External records from outside source obtained and reviewed including: Patient was evaluated by his primary care provider on 02/28/2023.  At that time was diagnosed with acute prostatitis.  Was prescribed Bactrim.  Labs:  I ordered, and personally interpreted labs.  The pertinent results include:  Blood cultures ordered results pending at time of signout Negative COVID, flu, RSV CMP with decreased sodium at 130, potassium at 3, slightly elevated glucose at 148, slightly elevated ALT at 47 otherwise unremarkable Lipase unremarkable CBC without leukocytosis, elevated hemoglobin 18 point otherwise unremarkable Urinalysis with a moderate amount of hemoglobin and trace protein otherwise unremarkable  Imaging: I ordered imaging studies including CXR, CT renal stone study I independently visualized and interpreted imaging which showed CXR with  Low lung  volumes with very mild right basilar atelectasis and/or  infiltrate.   CT renal stone study ordered with results pending at sign out I agree with the radiologist interpretation  Medications:  I ordered medication including tylenol, IVF, zofran, morphine, meropenem for symptom management.  I have reviewed the patients home medicines and have made adjustments as needed  Patient case discussed with Langston Masker, PA-C at sign-out. Plan at sign-out is pending CT scan, likely Admission to the hospital, however, plans may change as per oncoming team. Patient care transferred at sign out.    This chart was dictated using voice recognition software, Dragon. Despite the best efforts of this provider to proofread and correct errors, errors may still occur which can change documentation meaning.   Final Clinical Impression(s) / ED Diagnoses Final diagnoses:  None    Rx / DC Orders ED Discharge Orders     None         Lowry Bala A, PA-C 03/03/23 1843    Virgina Norfolk, DO 03/03/23 2325

## 2023-03-03 NOTE — Telephone Encounter (Signed)
We were hoping for improvement after switching antibiotic today but we will respect their decision

## 2023-03-03 NOTE — ED Provider Notes (Addendum)
Patient's care assumed by me at 7 PM.  Patient was seen by his primary care provider on 726 and diagnosed with prostatitis.  Patient was started on Augmentin 875 twice daily.  Patient reports today he began running a fever and had pain in his back.  Patient had a urine culture that showed ESBL E. coli.  Patient is pending lactic acid and CT renal.  Patient has been given IV ertapenem, Tylenol and morphine.  Previous provider advised admission after CT scan  Patient's laboratory evaluations returned.  His UA is shows 11-20 white blood cells patient's white blood cell count is 4.3.  Lactic acid is 1.2  CT scan shows prostatitis and pyelonephritis.  Hospitalist consulted for admission.  Dr. Haroldine Laws will admit   Elson Areas, PA-C 03/03/23 2241    Elson Areas, PA-C 03/03/23 2323    Virgina Norfolk, DO 03/03/23 2325

## 2023-03-04 ENCOUNTER — Encounter (HOSPITAL_COMMUNITY): Payer: Self-pay | Admitting: Family Medicine

## 2023-03-04 ENCOUNTER — Inpatient Hospital Stay (HOSPITAL_COMMUNITY): Payer: Self-pay

## 2023-03-04 ENCOUNTER — Telehealth: Payer: Self-pay

## 2023-03-04 DIAGNOSIS — N419 Inflammatory disease of prostate, unspecified: Secondary | ICD-10-CM | POA: Diagnosis present

## 2023-03-04 DIAGNOSIS — Z1612 Extended spectrum beta lactamase (ESBL) resistance: Secondary | ICD-10-CM

## 2023-03-04 DIAGNOSIS — N390 Urinary tract infection, site not specified: Secondary | ICD-10-CM

## 2023-03-04 DIAGNOSIS — B9629 Other Escherichia coli [E. coli] as the cause of diseases classified elsewhere: Secondary | ICD-10-CM

## 2023-03-04 LAB — CBC WITH DIFFERENTIAL/PLATELET
Abs Immature Granulocytes: 0.02 10*3/uL (ref 0.00–0.07)
Basophils Absolute: 0 10*3/uL (ref 0.0–0.1)
Basophils Relative: 1 %
Eosinophils Absolute: 0.1 10*3/uL (ref 0.0–0.5)
Eosinophils Relative: 2 %
HCT: 44 % (ref 39.0–52.0)
Hemoglobin: 15.8 g/dL (ref 13.0–17.0)
Immature Granulocytes: 1 %
Lymphocytes Relative: 28 %
Lymphs Abs: 1.2 10*3/uL (ref 0.7–4.0)
MCH: 30.3 pg (ref 26.0–34.0)
MCHC: 35.9 g/dL (ref 30.0–36.0)
MCV: 84.3 fL (ref 80.0–100.0)
Monocytes Absolute: 0.7 10*3/uL (ref 0.1–1.0)
Monocytes Relative: 17 %
Neutro Abs: 2.3 10*3/uL (ref 1.7–7.7)
Neutrophils Relative %: 51 %
Platelets: 149 10*3/uL — ABNORMAL LOW (ref 150–400)
RBC: 5.22 MIL/uL (ref 4.22–5.81)
RDW: 12.2 % (ref 11.5–15.5)
WBC: 4.4 10*3/uL (ref 4.0–10.5)
nRBC: 0 % (ref 0.0–0.2)

## 2023-03-04 LAB — COMPREHENSIVE METABOLIC PANEL
ALT: 228 U/L — ABNORMAL HIGH (ref 0–44)
AST: 268 U/L — ABNORMAL HIGH (ref 15–41)
Albumin: 2.8 g/dL — ABNORMAL LOW (ref 3.5–5.0)
Alkaline Phosphatase: 86 U/L (ref 38–126)
Anion gap: 9 (ref 5–15)
BUN: 11 mg/dL (ref 6–20)
CO2: 26 mmol/L (ref 22–32)
Calcium: 8.2 mg/dL — ABNORMAL LOW (ref 8.9–10.3)
Chloride: 96 mmol/L — ABNORMAL LOW (ref 98–111)
Creatinine, Ser: 0.82 mg/dL (ref 0.61–1.24)
GFR, Estimated: >60 mL/min (ref >60)
Glucose, Bld: 152 mg/dL — ABNORMAL HIGH (ref 70–99)
Potassium: 2.8 mmol/L — ABNORMAL LOW (ref 3.5–5.1)
Sodium: 131 mmol/L — ABNORMAL LOW (ref 135–145)
Total Bilirubin: 0.8 mg/dL (ref 0.3–1.2)
Total Protein: 6.4 g/dL — ABNORMAL LOW (ref 6.5–8.1)

## 2023-03-04 LAB — HEPATITIS PANEL, ACUTE
HCV Ab: NONREACTIVE
Hep A IgM: NONREACTIVE
Hep B C IgM: NONREACTIVE
Hepatitis B Surface Ag: NONREACTIVE

## 2023-03-04 LAB — HIV ANTIBODY (ROUTINE TESTING W REFLEX): HIV Screen 4th Generation wRfx: NONREACTIVE

## 2023-03-04 LAB — MAGNESIUM: Magnesium: 2.2 mg/dL (ref 1.7–2.4)

## 2023-03-04 MED ORDER — LACTATED RINGERS IV SOLN: INTRAVENOUS | Status: DC

## 2023-03-04 MED ORDER — KETOROLAC TROMETHAMINE 15 MG/ML IJ SOLN
15.0000 mg | Freq: Four times a day (QID) | INTRAMUSCULAR | Status: DC
  Administered 2023-03-04 – 2023-03-07 (×10): 15 mg via INTRAVENOUS
  Filled 2023-03-04 (×11): qty 1

## 2023-03-04 MED ORDER — ACETAMINOPHEN 500 MG PO TABS
500.0000 mg | ORAL_TABLET | Freq: Four times a day (QID) | ORAL | Status: AC | PRN
  Administered 2023-03-05: 500 mg via ORAL
  Filled 2023-03-04: qty 1

## 2023-03-04 MED ORDER — ENOXAPARIN SODIUM 40 MG/0.4ML IJ SOSY
40.0000 mg | PREFILLED_SYRINGE | INTRAMUSCULAR | Status: DC
  Administered 2023-03-04 – 2023-03-07 (×3): 40 mg via SUBCUTANEOUS
  Filled 2023-03-04 (×4): qty 0.4

## 2023-03-04 MED ORDER — ACETAMINOPHEN 650 MG RE SUPP: 650.0000 mg | Freq: Four times a day (QID) | RECTAL | Status: DC | PRN

## 2023-03-04 MED ORDER — POLYETHYLENE GLYCOL 3350 17 G PO PACK
17.0000 g | PACK | Freq: Every day | ORAL | Status: DC
  Administered 2023-03-04 – 2023-03-06 (×3): 17 g via ORAL
  Filled 2023-03-04: qty 1

## 2023-03-04 MED ORDER — ACETAMINOPHEN 325 MG PO TABS: 650.0000 mg | ORAL_TABLET | Freq: Four times a day (QID) | ORAL | Status: DC | PRN

## 2023-03-04 MED ORDER — MELATONIN 3 MG PO TABS: 3.0000 mg | ORAL_TABLET | Freq: Every evening | ORAL | Status: DC | PRN

## 2023-03-04 MED ORDER — POLYETHYLENE GLYCOL 3350 17 G PO PACK
17.0000 g | PACK | Freq: Every day | ORAL | Status: DC
  Administered 2023-03-04: 17 g via ORAL
  Filled 2023-03-04 (×4): qty 1

## 2023-03-04 MED ORDER — POTASSIUM CHLORIDE CRYS ER 20 MEQ PO TBCR
40.0000 meq | EXTENDED_RELEASE_TABLET | ORAL | Status: AC
  Administered 2023-03-04 (×2): 40 meq via ORAL
  Filled 2023-03-04 (×2): qty 2

## 2023-03-04 NOTE — Telephone Encounter (Signed)
Please call pt back with lab results 

## 2023-03-04 NOTE — Progress Notes (Signed)
   03/04/23 1504  Spiritual Encounters  Type of Visit Declined chaplain visit  Referral source Nurse (RN/NT/LPN)  OnCall Visit No   Responded to spiritual consult request. Patient and spouse [name]) were present. Both patient and spouse declined visit.

## 2023-03-04 NOTE — Telephone Encounter (Addendum)
Left a voice message asking patient to give our office a call back for recent lab results. Will try calling again.  ----- Message from Crist Infante Allwardt sent at 03/03/2023  9:21 AM EDT ----- Please call to check on patient today - how is he feeling? Urinary symptoms? Pain level? Fevers continued? Etc.  His infection is resistant to the Rocephin and bactrim we gave, need to choose alternative regimen. IF he is still feeling as poorly as he did Friday, would advise ER today.  If some improvements, we can continue outpatient regimen. Will attach his PCP to this message as well for review / input on antibiotic selection, thanks.

## 2023-03-04 NOTE — Plan of Care (Signed)

## 2023-03-04 NOTE — Plan of Care (Signed)

## 2023-03-04 NOTE — Progress Notes (Signed)
(  Carryover admission to the Day Admitter; accepted by Dr.  Joneen Roach as transfer from  [facility]  to a  med-surg bed at  [facility]  for  ESBL uti. Please see Dr.  Lajoyce Lauber transfer documentation in [facility] communication).   I have placed some additional preliminary admit orders via the adult multi-morbid admission order set. I have also ordered morning lab is in the form of CMP, CBC, serum magnesium level.  There is an existing order for meropenem.    Douglas Pigg, DO Hospitalist

## 2023-03-04 NOTE — H&P (Signed)
History and Physical    [name]:[id number] DOB: [birth year] DOA: 03/03/2023  PCP: Shelva Majestic, MD  Patient coming from: home  I have personally briefly reviewed patient's old medical records in [facility] Link  Chief Complaint: prostatitis  HPI: Douglas Ford is Douglas Ford 57 y.o. male with medical history significant of hypertension presenting with fevers, chills, and abdominal pain with diagnosis of prostatitis failing outpatient treatment.  He notes symptoms started around Thursday.  Noticed chills after eating meal.  Next day, felt chills, shaky, cold.  He was dribbling when he tried to pee. He saw PA on 7/26 and was diagnosed with UTI (prostatitis vs pyelonephritis) and prescribed bactrim and given dose of rocephin.  He continued to feel poorly over the weekend.  He went to the ED on 7/29.  Notes headache, no sore throat, no cough, no CP, no SOB, no diarrhea.  Denies smoking or drinking.  ED Course: Transfer to hospitalist for treatment of esbl prostatits/pyelonephritis.   Review of Systems: As per HPI otherwise all other systems reviewed and are negative.  Past Medical History:  Diagnosis Date   Hypertension    Lipoma of back 08/2014   ultrasound-confirmed    Past Surgical History:  Procedure Laterality Date   LAPAROSCOPIC CHOLECYSTECTOMY  2005   TONSILECTOMY, ADENOIDECTOMY, BILATERAL MYRINGOTOMY AND TUBES      Social History  reports that he has never smoked. He has never used smokeless tobacco. He reports current alcohol use. He reports that he does not use drugs.  No Known Allergies  Family History  Problem Relation Age of Onset   Breast cancer Mother        mets   Other Father        poor communication   Breast cancer Sister    Non-Hodgkin's lymphoma Brother    Colon cancer Neg Hx      Prior to Admission medications   Medication Sig Start Date End Date Taking? Authorizing Provider  acetaminophen (TYLENOL) 325 MG tablet Take 650 mg by mouth every 6  (six) hours as needed for moderate pain or headache.    [provider]  amLODipine (NORVASC) 5 MG tablet Take 1 tablet (5 mg total) by mouth daily. 05/17/22   Shelva Majestic, MD  amoxicillin-clavulanate (AUGMENTIN) 875-125 MG tablet Take 1 tablet by mouth 2 (two) times daily. Take with food. 03/03/23   Allwardt, Crist Infante, PA-C  lisinopril-hydrochlorothiazide (ZESTORETIC) 20-25 MG tablet Take 1 tablet by mouth daily. 05/17/22   Shelva Majestic, MD  sulfamethoxazole-trimethoprim (BACTRIM DS) 800-160 MG tablet Take 1 tablet by mouth 2 (two) times daily. 02/28/23 04/11/23  Allwardt, Crist Infante, PA-C    Physical Exam: Vitals:   03/04/23 0300 03/04/23 0408 03/04/23 0543 03/04/23 0745  BP: 117/74 122/85  (!) 120/91  Pulse: 74 72  67  Resp: 13     Temp:  (!) 97.5 F (36.4 C)  98 F (36.7 C)  TempSrc:  Oral    SpO2: 98% 99%  97%  Weight:   80.8 kg   Height:        Constitutional: NAD, calm, comfortable Vitals:   03/04/23 0300 03/04/23 0408 03/04/23 0543 03/04/23 0745  BP: 117/74 122/85  (!) 120/91  Pulse: 74 72  67  Resp: 13     Temp:  (!) 97.5 F (36.4 C)  98 F (36.7 C)  TempSrc:  Oral    SpO2: 98% 99%  97%  Weight:   80.8 kg   Height:  Eyes: PERRL, lids and conjunctivae normal ENMT: Mucous membranes are moist.  Neck: normal, supple Respiratory: Normal respiratory effort. No accessory muscle use.  Cardiovascular: rrr Abdomen: suprapubic discomfort (mostly on L) and bilateral CVA tenderness Musculoskeletal: no clubbing / cyanosis. No joint deformity upper and lower extremities. Good ROM, no contractures. Normal muscle tone.  Skin: no rashes, lesions, ulcers. No induration Neurologic: CN 2-12 grossly intact. Sensation intact, DTR normal. Strength 5/5 in all 4.  Psychiatric: Normal judgment and insight. Alert and oriented x 3. Normal mood.   Labs on Admission: I have personally reviewed following labs and imaging studies  CBC: Recent Labs  Lab 03/03/23 1412  03/04/23 0547  WBC 4.3 4.4  NEUTROABS  --  2.3  HGB 18.2* 15.8  HCT 49.5 44.0  MCV 84.5 84.3  PLT 168 149*    Basic Metabolic Panel: Recent Labs  Lab 03/03/23 1412 03/04/23 0547  NA 130* 131*  K 3.0* 2.8*  CL 94* 96*  CO2 25 26  GLUCOSE 148* 152*  BUN 13 11  CREATININE 1.01 0.82  CALCIUM 9.3 8.2*  MG  --  2.2    GFR: Estimated Creatinine Clearance: 95.3 mL/min (by C-G formula based on SCr of 0.82 mg/dL).  Liver Function Tests: Recent Labs  Lab 03/03/23 1412 03/04/23 0547  AST 41 268*  ALT 47* 228*  ALKPHOS 46 86  BILITOT 0.6 0.8  PROT 7.9 6.4*  ALBUMIN 4.2 2.8*    Urine analysis:    Component Value Date/Time   COLORURINE YELLOW 03/03/2023 1412   APPEARANCEUR CLEAR 03/03/2023 1412   LABSPEC 1.023 03/03/2023 1412   PHURINE 6.0 03/03/2023 1412   GLUCOSEU NEGATIVE 03/03/2023 1412   HGBUR MODERATE (Janoah Menna) 03/03/2023 1412   BILIRUBINUR NEGATIVE 03/03/2023 1412   BILIRUBINUR negative 02/28/2023 0946   KETONESUR NEGATIVE 03/03/2023 1412   PROTEINUR TRACE (Enoc Getter) 03/03/2023 1412   UROBILINOGEN 1.0 02/28/2023 0946   UROBILINOGEN 1.0 12/30/2016 1054   NITRITE NEGATIVE 03/03/2023 1412   LEUKOCYTESUR NEGATIVE 03/03/2023 1412    Radiological Exams on Admission: CT Renal Stone Study  Result Date: 03/03/2023 CLINICAL DATA:  Fever. EXAM: CT ABDOMEN AND PELVIS WITHOUT CONTRAST TECHNIQUE: Multidetector CT imaging of the abdomen and pelvis was performed following the standard protocol without IV contrast. RADIATION DOSE REDUCTION: This exam was performed according to the departmental dose-optimization program which includes automated exposure control, adjustment of the mA and/or kV according to patient size and/or use of iterative reconstruction technique. COMPARISON:  None Available. FINDINGS: Lower chest: Mild linear atelectasis is seen within the right lung base. Hepatobiliary: No focal liver abnormality is seen. Status post cholecystectomy. No biliary dilatation. Pancreas:  Unremarkable. No pancreatic ductal dilatation or surrounding inflammatory changes. Spleen: Normal in size without focal abnormality. Adrenals/Urinary Tract: Adrenal glands are unremarkable. Kidneys are normal in size, without renal calculi, focal lesion, or hydronephrosis. There is mild bilateral perinephric inflammatory fat stranding, right greater than left. Bladder is unremarkable. Stomach/Bowel: There is Tacari Repass small hiatal hernia. Appendix appears normal. No evidence of bowel wall thickening, distention, or inflammatory changes. Vascular/Lymphatic: No significant vascular findings are present. No enlarged abdominal or pelvic lymph nodes. Reproductive: There is mild to moderate severity prostate gland enlargement. Sydney Hasten mild amount of surrounding inflammatory fat stranding is seen. Other: No abdominal wall hernia or abnormality. No abdominopelvic ascites. Musculoskeletal: No acute or significant osseous findings. IMPRESSION: 1. Mild acute prostatitis. 2. Findings which may represent sequelae associated with acute pyelonephritis. Correlation with urinalysis is recommended. 3. Small hiatal hernia. 4. Evidence  of prior cholecystectomy. Electronically Signed   By: Aram Candela M.D.   On: 03/03/2023 20:38   DG Chest 2 View  Result Date: 03/03/2023 CLINICAL DATA:  Chills and occasional cough. EXAM: CHEST - 2 VIEW COMPARISON:  June 04, 2004 FINDINGS: The heart size and mediastinal contours are within normal limits. Low lung volumes are noted with mild elevation of the right hemidiaphragm. Very mild right basilar atelectasis and/or infiltrate is seen. No pleural effusion or pneumothorax is identified. Radiopaque surgical clips are present within the right upper quadrant. The visualized skeletal structures are unremarkable. IMPRESSION: Low lung volumes with very mild right basilar atelectasis and/or infiltrate. Electronically Signed   By: Aram Candela M.D.   On: 03/03/2023 15:28    EKG: Independently reviewed.  pending  Assessment/Plan Principal Problem:   UTI due to extended-spectrum beta lactamase (ESBL) producing Escherichia coli    Assessment and Plan:  Acute Prostatitis  Acute Pyelonephritis  ESBL UTI  Sepsis ruled out.  No true fevers, elevated temp to 100.3 x1 CT 7/29 with mild acute prostatitis, findings which may represent sequelae associated with acute pyelonephritis (mild bilateral perinephric inflammatory fat stranding) Continue meropenem Blood cultures pending Urine culture 7/26 with ESBL e. Coli  Will need several weeks of abx therapy, will c/s ID Pain management, bowel regimen   Elevated LFT's Unclear cause, will check RUQ [place] Hemodynamically mediated? Acute hepatitis panel, HIV  Hypokalemia Replace and follow   Headache Hold tylenol for now with LFT's Follow with toradol  Hyperglycemia Follow A1c  Hypertension Hold lisinopril hydrochlorothiazide Continue amlodipine    DVT prophylaxis: lovenox  Code Status:   full  Family Communication:  none  Disposition Plan:   Patient is from:  home  Anticipated DC to:  home  Anticipated DC date:  pending  Anticipated DC barriers: pending  Consults called:  ID  Admission status:  Inpatient    Severity of Illness: The appropriate patient status for this patient is INPATIENT. Inpatient status is judged to be reasonable and necessary in order to provide the required intensity of service to ensure the patient's safety. The patient's presenting symptoms, physical exam findings, and initial radiographic and laboratory data in the context of their chronic comorbidities is felt to place them at high risk for further clinical deterioration. Furthermore, it is not anticipated that the patient will be medically stable for discharge from the hospital within 2 midnights of admission.   * I certify that at the point of admission it is my clinical judgment that the patient will require inpatient hospital care spanning beyond 2  midnights from the point of admission due to high intensity of service, high risk for further deterioration and high frequency of surveillance required.Lacretia Nicks MD [facility]  How to contact the [facility] Attending or Consulting provider 7A - 7P or covering provider during after hours 7P -7A, for this patient?   Check the care team in [facility] and look for Jadie Allington) attending/consulting [facility] provider listed and b) the [facility] team listed Log into [email or web address] and use [place]'s universal password to access. If you do not have the password, please contact the hospital operator. Locate the [facility] provider you are looking for under [facility] and page to Ania Levay number that you can be directly reached. If you still have difficulty reaching the provider, please page the [facility] (Director on Call) for the Hospitalists listed on amion for assistance.  03/04/2023, 9:06 AM

## 2023-03-04 NOTE — TOC Initial Note (Signed)
Transition of Care [facility]) - Initial/Assessment Note    Patient Details  Name: Douglas Ford MRN: [id number] Date of Birth: May 25, 1966  Transition of [facility]) CM/SW Contact:    Kermit Balo, RN Phone Number: 03/04/2023, 3:56 PM  Clinical Narrative:                 Pt is from home with his spouse. They are together when not at work.  No DME at home. Pt drives self but spouse can provide transportation if needed.  Pt manages his own medications and denies any issues. He states he can afford his medications outside the hospital.  TOC following.  SDOH Interventions Today    Flowsheet Row Most Recent Value  SDOH Interventions   Food Insecurity Interventions Inpatient TOC  Transportation Interventions Inpatient TOC      No transportation issues. No food insecurities per pt.  Expected Discharge Plan: Home/Self Care Barriers to Discharge: Continued Medical Work up   Patient Goals and CMS Choice            Expected Discharge Plan and Services       Living arrangements for the past 2 months: Single Family Home                                      Prior Living Arrangements/Services Living arrangements for the past 2 months: Single Family Home Lives with:: Spouse Patient language and need for interpreter reviewed:: Yes          Care giver support system in place?: Yes (comment)   Criminal Activity/Legal Involvement Pertinent to Current Situation/Hospitalization: No - Comment as needed  Activities of Daily Living Home Assistive Devices/Equipment: None ADL Screening (condition at time of admission) Patient's cognitive ability adequate to safely complete daily activities?: Yes Is the patient deaf or have difficulty hearing?: No Does the patient have difficulty seeing, even when wearing glasses/contacts?: No Does the patient have difficulty concentrating, remembering, or making decisions?: No Patient able to express need for assistance with ADLs?: No Does  the patient have difficulty dressing or bathing?: No Independently performs ADLs?: Yes (appropriate for developmental age) Does the patient have difficulty walking or climbing stairs?: No Weakness of Legs: None Weakness of Arms/Hands: None  Permission Sought/Granted                  Emotional Assessment Appearance:: Appears stated age         Psych Involvement: No (comment)  Admission diagnosis:  Acute prostatitis [N41.0] Pyelonephritis [N12] UTI due to extended-spectrum beta lactamase (ESBL) producing Escherichia coli [N39.0, B96.29, Z16.12] Prostatitis [N41.9] Patient Active Problem List   Diagnosis Date Noted   Prostatitis 03/04/2023   UTI due to extended-spectrum beta lactamase (ESBL) producing Escherichia coli 03/03/2023   Obesity (BMI 30.0-34.9) 12/20/2017   GERD (gastroesophageal reflux disease) 09/03/2016   Headache 09/03/2016   HTN (hypertension), benign 08/30/2014   Subcutaneous mass 08/30/2014   PCP:  Shelva Majestic, MD Pharmacy:   [facility] [address], [place] - [address] [place] [address] Phone: [phone] Fax: [phone]     Social Determinants of Health (SDOH) Social History: SDOH Screenings   Food Insecurity: Food Insecurity Present (03/04/2023)  Housing: High Risk (03/04/2023)  Transportation Needs: No Transportation Needs (03/04/2023)  Utilities: Not At Risk (03/04/2023)  Depression (PHQ2-9): Low Risk  (02/28/2023)  Tobacco Use: Low Risk  (03/04/2023)  SDOH Interventions: Food Insecurity Interventions: Inpatient TOC Transportation Interventions: Inpatient TOC   Readmission Risk Interventions     No data to display

## 2023-03-04 NOTE — Telephone Encounter (Signed)
Patient returned call. He gave verbal permission for me to speak to his spouse Henrene Dodge. Results given to spouse. Informed that patient went to [facility] yesterday ans was transported to [facility] currently admitted.

## 2023-03-04 NOTE — ED Notes (Signed)
Report called to Marjorie Smolder, Therapist, sports.

## 2023-03-05 DIAGNOSIS — E871 Hypo-osmolality and hyponatremia: Secondary | ICD-10-CM

## 2023-03-05 DIAGNOSIS — N410 Acute prostatitis: Secondary | ICD-10-CM

## 2023-03-05 NOTE — Consult Note (Signed)
[facility]    Date of Admission:  03/03/2023     Reason for Consult: complicated uti/prostatitis    Referring Provider: Rito Ehrlich      Abx: 7/29-c meropenem  7/26-7/29 outpatient bactrim        Assessment: 57 yo male msm [facility] htn, admitted 7/29 with 3-4 days uti sx and fever, chill, found to have esbl uti/prostatitis  7/26 ucx grew esbl ecoli (r bactrim, cipro) Bactrim given during outpatient visit for uti sx 3 days prior to admission  Improving urinary retention and body ache/headache with meropenem  No oral medication available for his prostatitis   Plan: Continue meropenem. I would treat 2 weeks 7/29-8/12 On discharge if going home before 2 weeks could dc on ertapenem  If persistent urinary sx in another 2-3 days would do pelvis mri to look for prostatic abscess  Discussed with primary team     ------------------------------------------------ Principal Problem:   UTI due to extended-spectrum beta lactamase (ESBL) producing Escherichia coli Active Problems:   Prostatitis    HPI: Douglas Ford is a 57 y.o. male msm, failed outpatient bactrim tx for uti sx admitted 7/29   He was seen 7/26 pcp clinic care given bactrim for uti sx, fever, chill, dribbling urine flow  Associated headache/myalgia/malaise  Worsening sx so came to ed  Fever 100s Ct suggested pyelo/prostatitis No leukotosis Lft mildly elevated -- improving Bcx negative Ucx esbl ecoli  Improving on meropenem   Family History  Problem Relation Age of Onset   Breast cancer Mother        mets   Other Father        poor communication   Breast cancer Sister    Non-Hodgkin's lymphoma Brother    Colon cancer Neg Hx     Social History   Tobacco Use   Smoking status: Never   Smokeless tobacco: Never  Substance Use Topics   Alcohol use: Yes    Comment: social   Drug use: No    No Known Allergies  Review of Systems: ROS All Other ROS was  negative, except mentioned above   Past Medical History:  Diagnosis Date   Hypertension    Lipoma of back 08/2014   ultrasound-confirmed       Scheduled Meds:  amLODipine  5 mg Oral Daily   enoxaparin (LOVENOX) injection  40 mg Subcutaneous Q24H   ketorolac  15 mg Intravenous Q6H   polyethylene glycol  17 g Oral Daily   polyethylene glycol  17 g Oral Daily   Continuous Infusions:  lactated ringers 125 mL/hr at 03/05/23 0445   meropenem (MERREM) IV 1 g (03/05/23 0856)   PRN Meds:.acetaminophen, melatonin, morphine injection, ondansetron (ZOFRAN) IV   OBJECTIVE: Blood pressure 114/81, pulse 76, temperature 98 F (36.7 C), temperature source Oral, resp. rate 18, height 5\' 4"  (1.626 m), weight 84.7 kg, SpO2 98%.  Physical Exam  General/constitutional: no distress, pleasant HEENT: Normocephalic, PER, Conj Clear, EOMI, Oropharynx clear Neck supple CV: rrr no mrg Lungs: clear to auscultation, normal respiratory effort Abd: Soft, Nontender Ext: no edema Skin: No Rash Neuro: nonfocal MSK: no peripheral joint swelling/tenderness/warmth; back spines nontender    Lab Results Lab Results  Component Value Date   WBC 4.9 03/05/2023   HGB 15.0 03/05/2023   HCT 42.4 03/05/2023   MCV 87.8 03/05/2023   PLT 164 03/05/2023    Lab Results  Component Value Date   CREATININE 0.83 03/05/2023  BUN 13 03/05/2023   NA 134 (L) 03/05/2023   K 3.6 03/05/2023   CL 102 03/05/2023   CO2 23 03/05/2023    Lab Results  Component Value Date   ALT 154 (H) 03/05/2023   AST 77 (H) 03/05/2023   ALKPHOS 65 03/05/2023   BILITOT 0.3 03/05/2023      Microbiology: Recent Results (from the past 240 hour(s))  Urine Culture     Status: Abnormal   Collection Time: 02/28/23  9:39 AM   Specimen: Urine  Result Value Ref Range Status   MICRO NUMBER: [id number]  Final   SPECIMEN QUALITY: Adequate  Final   Sample Source URINE  Final   STATUS: FINAL  Final   ISOLATE 1: ESBL Escherichia coli  (A)  Final    Comment: Greater than 100,000 CFU/mL of Escherichia coli (ESBL) ESBL RESULT:        The organism has been confirmed as an ESBL producer.      Susceptibility   Esbl escherichia coli - URINE CULTURE, REFLEX    AMOX/CLAVULANIC 4 Sensitive     AMPICILLIN* >=32 Resistant      * Extended spectrum beta-lactamase (ESBL) producing organisms demonstrate decreased activity with penicillins, cephalosporins and aztreonam.     AMPICILLIN/SULBACTAM 4 Sensitive     CEFAZOLIN* >=64 Resistant      * Extended spectrum beta-lactamase (ESBL) producing organisms demonstrate decreased activity with penicillins, cephalosporins and aztreonam. For uncomplicated UTI caused by E. coli, K. pneumoniae or P. mirabilis: Cefazolin is susceptible if MIC <32 mcg/mL and predicts susceptible to the oral agents cefaclor, cefdinir, cefpodoxime, cefprozil, cefuroxime, cephalexin and loracarbef.     CEFTAZIDIME 4 Sensitive     CEFEPIME <=1 Sensitive     CEFTRIAXONE 16 Resistant     CIPROFLOXACIN >=4 Resistant     LEVOFLOXACIN >=8 Resistant     GENTAMICIN <=1 Sensitive     IMIPENEM <=0.25 Sensitive     NITROFURANTOIN <=16 Sensitive     PIP/TAZO <=4 Sensitive     TOBRAMYCIN <=1 Sensitive     TRIMETH/SULFA* >=320 Resistant      * Extended spectrum beta-lactamase (ESBL) producing organisms demonstrate decreased activity with penicillins, cephalosporins and aztreonam. For uncomplicated UTI caused by E. coli, K. pneumoniae or P. mirabilis: Cefazolin is susceptible if MIC <32 mcg/mL and predicts susceptible to the oral agents cefaclor, cefdinir, cefpodoxime, cefprozil, cefuroxime, cephalexin and loracarbef. Legend: S = Susceptible  I = Intermediate R = Resistant  NS = Not susceptible * = Not tested  NR = Not reported **NN = See antimicrobic comments   Resp panel by RT-PCR (RSV, Flu A&B, Covid) Anterior Nasal Swab     Status: None   Collection Time: 03/03/23  2:12 PM   Specimen: Anterior Nasal Swab   Result Value Ref Range Status   SARS Coronavirus 2 by RT PCR NEGATIVE NEGATIVE Final    Comment: (NOTE) SARS-CoV-2 target nucleic acids are NOT DETECTED.  The SARS-CoV-2 RNA is generally detectable in upper respiratory specimens during the acute phase of infection. The lowest concentration of SARS-CoV-2 viral copies this assay can detect is 138 copies/mL. A negative result does not preclude SARS-Cov-2 infection and should not be used as the sole basis for treatment or other patient management decisions. A negative result may occur with  improper specimen collection/handling, submission of specimen other than nasopharyngeal swab, presence of viral mutation(s) within the areas targeted by this assay, and inadequate number of viral copies(<138 copies/mL). A negative result must be combined with  clinical observations, patient history, and epidemiological information. The expected result is Negative.  Fact Sheet for Patients:  BloggerCourse.com  Fact Sheet for Healthcare Providers:  SeriousBroker.it  This test is no t yet approved or cleared by the [place] FDA and  has been authorized for detection and/or diagnosis of SARS-CoV-2 by FDA under an Emergency Use Authorization (EUA). This EUA will remain  in effect (meaning this test can be used) for the duration of the COVID-19 declaration under Section 564(b)(1) of the Act, 21 U.S.C.section 360bbb-3(b)(1), unless the authorization is terminated  or revoked sooner.       Influenza A by PCR NEGATIVE NEGATIVE Final   Influenza B by PCR NEGATIVE NEGATIVE Final    Comment: (NOTE) The Xpert Xpress SARS-CoV-2/FLU/RSV plus assay is intended as an aid in the diagnosis of influenza from Nasopharyngeal swab specimens and should not be used as a sole basis for treatment. Nasal washings and aspirates are unacceptable for Xpert Xpress SARS-CoV-2/FLU/RSV testing.  Fact Sheet for  Patients: BloggerCourse.com  Fact Sheet for Healthcare Providers: SeriousBroker.it  This test is not yet approved or cleared by the [place] FDA and has been authorized for detection and/or diagnosis of SARS-CoV-2 by FDA under an Emergency Use Authorization (EUA). This EUA will remain in effect (meaning this test can be used) for the duration of the COVID-19 declaration under Section 564(b)(1) of the Act, 21 U.S.C. section 360bbb-3(b)(1), unless the authorization is terminated or revoked.     Resp Syncytial Virus by PCR NEGATIVE NEGATIVE Final    Comment: (NOTE) Fact Sheet for Patients: BloggerCourse.com  Fact Sheet for Healthcare Providers: SeriousBroker.it  This test is not yet approved or cleared by the [place] FDA and has been authorized for detection and/or diagnosis of SARS-CoV-2 by FDA under an Emergency Use Authorization (EUA). This EUA will remain in effect (meaning this test can be used) for the duration of the COVID-19 declaration under Section 564(b)(1) of the Act, 21 U.S.C. section 360bbb-3(b)(1), unless the authorization is terminated or revoked.  Performed at [facility], [address] [place], [place] [address]   Blood culture (routine x 2)     Status: None (Preliminary result)   Collection Time: 03/03/23  6:02 PM   Specimen: BLOOD  Result Value Ref Range Status   Specimen Description   Final    BLOOD RIGHT ANTECUBITAL Performed at [facility], [address], [place], [place] [address]    Special Requests   Final    BOTTLES DRAWN AEROBIC AND ANAEROBIC Blood Culture adequate volume Performed at [facility], [address] [place], [place] [address]    Culture   Final    NO GROWTH 2 DAYS Performed at [facility], [address] [place], [place] [address]    Report Status  PENDING  Incomplete  Blood culture (routine x 2)     Status: None (Preliminary result)   Collection Time: 03/03/23  6:02 PM   Specimen: BLOOD LEFT HAND  Result Value Ref Range Status   Specimen Description   Final    BLOOD LEFT HAND Performed at [facility], [address] [place], [place] [address]    Special Requests   Final    BOTTLES DRAWN AEROBIC AND ANAEROBIC Blood Culture adequate volume Performed at [facility], [address], [place], [place] [address]    Culture   Final    NO GROWTH 2 DAYS Performed at [facility], [address], [place], [place] [address]    Report Status PENDING  Incomplete     Serology:    Imaging: If present, new imagings (plain films, ct scans, and mri) have been personally visualized and interpreted; radiology reports have been reviewed. Decision making incorporated into the Impression / Recommendations.  7/29 abd pelv ct noncontrast (renal ston protocol) 1. Mild acute prostatitis. 2. Findings which may represent sequelae associated with acute pyelonephritis. Correlation with urinalysis is recommended. 3. Small hiatal hernia. 4. Evidence of prior cholecystectomy.  Raymondo Band, MD [facility] [phone] pager    03/05/2023, 1:53 PM

## 2023-03-05 NOTE — Progress Notes (Signed)
PHARMACY CONSULT NOTE FOR:  OUTPATIENT  PARENTERAL ANTIBIOTIC THERAPY (OPAT)  Indication: ESBL UTI/prostatitis Regimen: Ertapenem 1g IV every 24 hours End date: 03/17/23  IV antibiotic discharge orders are pended. To discharging provider:  please sign these orders via discharge navigator,  Select New Orders & click on the button choice - Manage This Unsigned Work.     Thank you for allowing pharmacy to be a part of this patient's care.  Georgina Pillion, PharmD, BCPS, BCIDP Infectious Diseases Clinical Pharmacist 03/06/2023 9:24 AM   **Pharmacist phone directory can now be found on [email or web address] ([email or web address]).  Listed under [facility].

## 2023-03-05 NOTE — Progress Notes (Signed)
[facility] PROGRESS NOTE   Douglas Ford LKG:[id number] DOB: 10-25-1965 DOA: 03/03/2023  PCP: Shelva Majestic, MD  Brief History/Interval Summary:  57 y.o. male with medical history significant for hypertension presenting with fevers, chills, and abdominal pain with diagnosis of prostatitis failing outpatient treatment.  Patient was hospitalized for further management.  Found to have ESBL E. coli infection.  Consultants: Infectious disease  Procedures: None yet    Subjective/Interval History: Patient complains of occasional discomfort in the lower abdomen when he urinates.  Denies any flank pain.  No fever or chills.    Assessment/Plan:  Acute pyelonephritis/acute prostatitis/ESBL UTI Urine culture from 7/26 was positive for ESBL E. coli. CT scan on 7/29 showed mild acute prostatitis.  Concern was also raised for pyelonephritis. Blood cultures negative so far. Patient currently on meropenem. Infectious disease has been consulted to assist with antibiotic management.  Abnormal LFTs Elevated AST and ALT level noted.  No abnormality noted on recent CT scan.  Status post previous cholecystectomy.  Underwent ultrasound which showed fatty liver.  No ductal dilatation. Noted to be better today.  Hepatitis panel is negative.  Continue to trend.  Hypokalemia Repleted.  Hyponatremia Improved  Essential hypertension ACE inhibitor and HCTZ currently on hold.  Monitor blood pressures.  Amlodipine is being continued.  Hyperglycemia HbA1c is 6.0.  Outpatient management.  Obesity Estimated body mass index is 32.05 kg/m as calculated from the following:   Height as of this encounter: 5\' 4"  (1.626 m).   Weight as of this encounter: 84.7 kg.   DVT Prophylaxis: Lovenox Code Status: Full code Family Communication: Discussed with patient and spouse Disposition Plan: Hopefully home when improved  Status is: Inpatient Remains inpatient appropriate because: Acute  pyelonephritis      Medications: Scheduled:  amLODipine  5 mg Oral Daily   enoxaparin (LOVENOX) injection  40 mg Subcutaneous Q24H   ketorolac  15 mg Intravenous Q6H   polyethylene glycol  17 g Oral Daily   polyethylene glycol  17 g Oral Daily   Continuous:  lactated ringers 125 mL/hr at 03/05/23 0445   meropenem (MERREM) IV 1 g (03/05/23 0856)   GUY:[id number], melatonin, morphine injection, ondansetron (ZOFRAN) IV  Antibiotics: Anti-infectives (From admission, onward)    Start     Dose/Rate Route Frequency Ordered Stop   03/03/23 2345  vancomycin (VANCOCIN) capsule 125 mg  Status:  Discontinued        125 mg Oral 4 times daily 03/03/23 2330 03/03/23 2337   03/03/23 1600  meropenem (MERREM) 1 g in sodium chloride 0.9 % 100 mL IVPB        1 g 200 mL/hr over 30 Minutes Intravenous Every 8 hours 03/03/23 1520         Objective:  Vital Signs  Vitals:   03/04/23 2345 03/05/23 0351 03/05/23 0500 03/05/23 0739  BP: 113/78 108/70  114/81  Pulse: 70 62  76  Resp:    18  Temp: 98.3 F (36.8 C) 98 F (36.7 C)  98 F (36.7 C)  TempSrc: Oral Oral  Oral  SpO2: 97% 98%  98%  Weight:   84.7 kg   Height:       No intake or output data in the 24 hours ending 03/05/23 0934 Filed Weights   03/03/23 1404 03/04/23 0543 03/05/23 0500  Weight: 83.4 kg 80.8 kg 84.7 kg    General appearance: Awake alert.  In no distress Resp: Clear to auscultation bilaterally.  Normal effort Cardio: S1-S2 is  normal regular.  No S3-S4.  No rubs murmurs or bruit GI: Abdomen is soft.  Nontender nondistended.  Bowel sounds are present normal.  No masses organomegaly No obvious focal neurological deficits.  Lab Results:  Data Reviewed: I have personally reviewed following labs and reports of the imaging studies  CBC: Recent Labs  Lab 03/03/23 1412 03/04/23 0547 03/05/23 0206  WBC 4.3 4.4 4.9  NEUTROABS  --  2.3 2.3  HGB 18.2* 15.8 15.0  HCT 49.5 44.0 42.4  MCV 84.5 84.3 87.8  PLT 168  149* 164    Basic Metabolic Panel: Recent Labs  Lab 03/03/23 1412 03/04/23 0547 03/05/23 0206  NA 130* 131* 134*  K 3.0* 2.8* 3.6  CL 94* 96* 102  CO2 25 26 23   GLUCOSE 148* 152* 141*  BUN 13 11 13   CREATININE 1.01 0.82 0.83  CALCIUM 9.3 8.2* 8.2*  MG  --  2.2 1.9    GFR: Estimated Creatinine Clearance: 96.4 mL/min (by C-G formula based on SCr of 0.83 mg/dL).  Liver Function Tests: Recent Labs  Lab 03/03/23 1412 03/04/23 0547 03/05/23 0206  AST 41 268* 77*  ALT 47* 228* 154*  ALKPHOS 46 86 65  BILITOT 0.6 0.8 0.3  PROT 7.9 6.4* 6.0*  ALBUMIN 4.2 2.8* 2.7*    Recent Labs  Lab 03/03/23 1412  LIPASE 20    HbA1C: Recent Labs    03/04/23 1036  HGBA1C 6.0*     Recent Results (from the past 240 hour(s))  Urine Culture     Status: Abnormal   Collection Time: 02/28/23  9:39 AM   Specimen: Urine  Result Value Ref Range Status   MICRO NUMBER: [id number]  Final   SPECIMEN QUALITY: Adequate  Final   Sample Source URINE  Final   STATUS: FINAL  Final   ISOLATE 1: ESBL Escherichia coli (A)  Final    Comment: Greater than 100,000 CFU/mL of Escherichia coli (ESBL) ESBL RESULT:        The organism has been confirmed as an ESBL producer.      Susceptibility   Esbl escherichia coli - URINE CULTURE, REFLEX    AMOX/CLAVULANIC 4 Sensitive     AMPICILLIN* >=32 Resistant      * Extended spectrum beta-lactamase (ESBL) producing organisms demonstrate decreased activity with penicillins, cephalosporins and aztreonam.     AMPICILLIN/SULBACTAM 4 Sensitive     CEFAZOLIN* >=64 Resistant      * Extended spectrum beta-lactamase (ESBL) producing organisms demonstrate decreased activity with penicillins, cephalosporins and aztreonam. For uncomplicated UTI caused by E. coli, K. pneumoniae or P. mirabilis: Cefazolin is susceptible if MIC <32 mcg/mL and predicts susceptible to the oral agents cefaclor, cefdinir, cefpodoxime, cefprozil, cefuroxime, cephalexin and loracarbef.      CEFTAZIDIME 4 Sensitive     CEFEPIME <=1 Sensitive     CEFTRIAXONE 16 Resistant     CIPROFLOXACIN >=4 Resistant     LEVOFLOXACIN >=8 Resistant     GENTAMICIN <=1 Sensitive     IMIPENEM <=0.25 Sensitive     NITROFURANTOIN <=16 Sensitive     PIP/TAZO <=4 Sensitive     TOBRAMYCIN <=1 Sensitive     TRIMETH/SULFA* >=320 Resistant      * Extended spectrum beta-lactamase (ESBL) producing organisms demonstrate decreased activity with penicillins, cephalosporins and aztreonam. For uncomplicated UTI caused by E. coli, K. pneumoniae or P. mirabilis: Cefazolin is susceptible if MIC <32 mcg/mL and predicts susceptible to the oral agents cefaclor, cefdinir, cefpodoxime, cefprozil, cefuroxime, cephalexin and loracarbef.  Legend: S = Susceptible  I = Intermediate R = Resistant  NS = Not susceptible * = Not tested  NR = Not reported **NN = See antimicrobic comments   Resp panel by RT-PCR (RSV, Flu A&B, Covid) Anterior Nasal Swab     Status: None   Collection Time: 03/03/23  2:12 PM   Specimen: Anterior Nasal Swab  Result Value Ref Range Status   SARS Coronavirus 2 by RT PCR NEGATIVE NEGATIVE Final    Comment: (NOTE) SARS-CoV-2 target nucleic acids are NOT DETECTED.  The SARS-CoV-2 RNA is generally detectable in upper respiratory specimens during the acute phase of infection. The lowest concentration of SARS-CoV-2 viral copies this assay can detect is 138 copies/mL. A negative result does not preclude SARS-Cov-2 infection and should not be used as the sole basis for treatment or other patient management decisions. A negative result may occur with  improper specimen collection/handling, submission of specimen other than nasopharyngeal swab, presence of viral mutation(s) within the areas targeted by this assay, and inadequate number of viral copies(<138 copies/mL). A negative result must be combined with clinical observations, patient history, and epidemiological information. The expected  result is Negative.  Fact Sheet for Patients:  BloggerCourse.com  Fact Sheet for Healthcare Providers:  SeriousBroker.it  This test is no t yet approved or cleared by the [place] FDA and  has been authorized for detection and/or diagnosis of SARS-CoV-2 by FDA under an Emergency Use Authorization (EUA). This EUA will remain  in effect (meaning this test can be used) for the duration of the COVID-19 declaration under Section 564(b)(1) of the Act, 21 U.S.C.section 360bbb-3(b)(1), unless the authorization is terminated  or revoked sooner.       Influenza A by PCR NEGATIVE NEGATIVE Final   Influenza B by PCR NEGATIVE NEGATIVE Final    Comment: (NOTE) The Xpert Xpress SARS-CoV-2/FLU/RSV plus assay is intended as an aid in the diagnosis of influenza from Nasopharyngeal swab specimens and should not be used as a sole basis for treatment. Nasal washings and aspirates are unacceptable for Xpert Xpress SARS-CoV-2/FLU/RSV testing.  Fact Sheet for Patients: BloggerCourse.com  Fact Sheet for Healthcare Providers: SeriousBroker.it  This test is not yet approved or cleared by the [place] FDA and has been authorized for detection and/or diagnosis of SARS-CoV-2 by FDA under an Emergency Use Authorization (EUA). This EUA will remain in effect (meaning this test can be used) for the duration of the COVID-19 declaration under Section 564(b)(1) of the Act, 21 U.S.C. section 360bbb-3(b)(1), unless the authorization is terminated or revoked.     Resp Syncytial Virus by PCR NEGATIVE NEGATIVE Final    Comment: (NOTE) Fact Sheet for Patients: BloggerCourse.com  Fact Sheet for Healthcare Providers: SeriousBroker.it  This test is not yet approved or cleared by the [place] FDA and has been authorized for detection and/or diagnosis of  SARS-CoV-2 by FDA under an Emergency Use Authorization (EUA). This EUA will remain in effect (meaning this test can be used) for the duration of the COVID-19 declaration under Section 564(b)(1) of the Act, 21 U.S.C. section 360bbb-3(b)(1), unless the authorization is terminated or revoked.  Performed at [facility], [address], [place], [place] [address]   Blood culture (routine x 2)     Status: None (Preliminary result)   Collection Time: 03/03/23  6:02 PM   Specimen: BLOOD  Result Value Ref Range Status   Specimen Description   Final    BLOOD RIGHT ANTECUBITAL Performed at [facility], [address] [place], [place] [address]    Special Requests   Final    BOTTLES DRAWN AEROBIC AND ANAEROBIC Blood Culture adequate volume Performed at [facility], [address], [place], [place] [address]    Culture   Final    NO GROWTH 2 DAYS Performed at [facility], [address], [place], [place] [address]    Report Status PENDING  Incomplete  Blood culture (routine x 2)     Status: None (Preliminary result)   Collection Time: 03/03/23  6:02 PM   Specimen: BLOOD LEFT HAND  Result Value Ref Range Status   Specimen Description   Final    BLOOD LEFT HAND Performed at [facility], [address] [place], [place] [address]    Special Requests   Final    BOTTLES DRAWN AEROBIC AND ANAEROBIC Blood Culture adequate volume Performed at [facility], [address], [place], [place] [address]    Culture   Final    NO GROWTH 2 DAYS Performed at [facility], [address] [place], [place] [address]    Report Status PENDING  Incomplete      Radiology Studies: US Abdomen Limited RUQ (LIVER/GB)  Result Date: 03/04/2023 CLINICAL DATA:  Liver injury EXAM: ULTRASOUND ABDOMEN LIMITED RIGHT UPPER QUADRANT COMPARISON:  CT 03/03/2023 FINDINGS: Gallbladder: Surgically absent Common bile  duct: Diameter: 3 mm Liver: Echogenic hepatic parenchyma consistent with fatty liver infiltration. With this level of echogenicity evaluation for underlying mass lesion is limited and if needed contrast CT or MRI as clinically directed. Portal vein is patent on color Doppler imaging with normal direction of blood flow towards the liver. Other: None. IMPRESSION: Previous cholecystectomy. No ductal dilatation. Fatty liver infiltration. If there is further concern of liver abnormality a contrast study may be useful for further delineation when clinically appropriate Electronically Signed   By: Karen Kays M.D.   On: 03/04/2023 13:00   CT Renal Stone Study  Result Date: 03/03/2023 CLINICAL DATA:  Fever. EXAM: CT ABDOMEN AND PELVIS WITHOUT CONTRAST TECHNIQUE: Multidetector CT imaging of the abdomen and pelvis was performed following the standard protocol without IV contrast. RADIATION DOSE REDUCTION: This exam was performed according to the departmental dose-optimization program which includes automated exposure control, adjustment of the mA and/or kV according to patient size and/or use of iterative reconstruction technique. COMPARISON:  None Available. FINDINGS: Lower chest: Mild linear atelectasis is seen within the right lung base. Hepatobiliary: No focal liver abnormality is seen. Status post cholecystectomy. No biliary dilatation. Pancreas: Unremarkable. No pancreatic ductal dilatation or surrounding inflammatory changes. Spleen: Normal in size without focal abnormality. Adrenals/Urinary Tract: Adrenal glands are unremarkable. Kidneys are normal in size, without renal calculi, focal lesion, or hydronephrosis. There is mild bilateral perinephric inflammatory fat stranding, right greater than left. Bladder is unremarkable. Stomach/Bowel: There is a small hiatal hernia. Appendix appears normal. No evidence of bowel wall thickening, distention, or inflammatory changes. Vascular/Lymphatic: No significant vascular  findings are present. No enlarged abdominal or pelvic lymph nodes. Reproductive: There is mild to moderate severity prostate gland enlargement. A mild amount of surrounding inflammatory fat stranding is seen. Other: No abdominal wall hernia or abnormality. No abdominopelvic ascites. Musculoskeletal: No acute or significant osseous findings. IMPRESSION: 1. Mild acute prostatitis. 2. Findings which may represent sequelae associated with acute pyelonephritis. Correlation with urinalysis is recommended. 3. Small hiatal hernia. 4. Evidence of prior cholecystectomy. Electronically Signed   By: Aram Candela  M.D.   On: 03/03/2023 20:38   DG Chest 2 View  Result Date: 03/03/2023 CLINICAL DATA:  Chills and occasional cough. EXAM: CHEST - 2 VIEW COMPARISON:  June 04, 2004 FINDINGS: The heart size and mediastinal contours are within normal limits. Low lung volumes are noted with mild elevation of the right hemidiaphragm. Very mild right basilar atelectasis and/or infiltrate is seen. No pleural effusion or pneumothorax is identified. Radiopaque surgical clips are present within the right upper quadrant. The visualized skeletal structures are unremarkable. IMPRESSION: Low lung volumes with very mild right basilar atelectasis and/or infiltrate. Electronically Signed   By: Aram Candela M.D.   On: 03/03/2023 15:28       LOS: 1 day   Osvaldo Shipper  [facility] Pager on [email or web address]  03/05/2023, 9:34 AM

## 2023-03-06 ENCOUNTER — Other Ambulatory Visit: Payer: Self-pay

## 2023-03-06 DIAGNOSIS — N12 Tubulo-interstitial nephritis, not specified as acute or chronic: Secondary | ICD-10-CM

## 2023-03-06 MED ORDER — SODIUM CHLORIDE 0.9 % IV SOLN
1.0000 g | Freq: Every day | INTRAVENOUS | Status: DC
  Administered 2023-03-06 – 2023-03-07 (×2): 1000 mg via INTRAVENOUS
  Filled 2023-03-06 (×2): qty 1

## 2023-03-06 NOTE — Progress Notes (Signed)
[facility] PROGRESS NOTE   Douglas Ford GNF:[id number] DOB: 07-13-1966 DOA: 03/03/2023  PCP: Shelva Majestic, MD  Brief History/Interval Summary:  56 y.o. male with medical history significant for hypertension presenting with fevers, chills, and abdominal pain with diagnosis of prostatitis failing outpatient treatment.  Patient was hospitalized for further management.  Found to have ESBL E. coli infection.  Consultants: Infectious disease  Procedures: None yet    Subjective/Interval History: He mentioned that he is feeling much better.  Denies any abdominal discomfort.  No discomfort with urination today.  No nausea vomiting.  He understands the need for IV antibiotics at discharge.    Assessment/Plan:  Acute pyelonephritis/acute prostatitis/ESBL UTI Urine culture from 7/26 was positive for ESBL E. coli. CT scan on 7/29 showed mild acute prostatitis.  Concern was also raised for pyelonephritis. Blood cultures negative so far. Patient currently on meropenem. These.  They recommend meropenem in the hospital and then ertapenem at discharge to be continued till 03/17/2023. Symptomatically he is improving.  He is afebrile.  WBC is normal.  Abnormal LFTs Elevated AST and ALT level noted.  No abnormality noted on recent CT scan.  Status post previous cholecystectomy.  Underwent ultrasound which showed fatty liver.  No ductal dilatation. Possibly due to acute illness.  LFTs are trending down.  Hepatitis panel is negative.  Abdomen remains benign.  Hypokalemia Repleted.  Hyponatremia Improved  Essential hypertension ACE inhibitor and HCTZ currently on hold.  Borderline low blood pressures noted occasionally.  Continue amlodipine for now.  Continue to monitor.   Hyperglycemia HbA1c is 6.0.  Outpatient management.  Obesity Estimated body mass index is 32.05 kg/m as calculated from the following:   Height as of this encounter: 5\' 4"  (1.626 m).   Weight as of this  encounter: 84.7 kg.   DVT Prophylaxis: Lovenox Code Status: Full code Family Communication: Discussed with patient and spouse Disposition Plan: Hopefully home when improved  Status is: Inpatient Remains inpatient appropriate because: Acute pyelonephritis      Medications: Scheduled:  amLODipine  5 mg Oral Daily   enoxaparin (LOVENOX) injection  40 mg Subcutaneous Q24H   ketorolac  15 mg Intravenous Q6H   polyethylene glycol  17 g Oral Daily   polyethylene glycol  17 g Oral Daily   Continuous:  ertapenem     lactated ringers 125 mL/hr at 03/06/23 1002   QIO:[id number], melatonin, morphine injection, ondansetron (ZOFRAN) IV  Antibiotics: Anti-infectives (From admission, onward)    Start     Dose/Rate Route Frequency Ordered Stop   03/06/23 1600  ertapenem (INVANZ) 1,000 mg in sodium chloride 0.9 % 100 mL IVPB        1 g 200 mL/hr over 30 Minutes Intravenous Daily 03/06/23 0921 03/17/23 2359   03/03/23 2345  vancomycin (VANCOCIN) capsule 125 mg  Status:  Discontinued        125 mg Oral 4 times daily 03/03/23 2330 03/03/23 2337   03/03/23 1600  meropenem (MERREM) 1 g in sodium chloride 0.9 % 100 mL IVPB  Status:  Discontinued        1 g 200 mL/hr over 30 Minutes Intravenous Every 8 hours 03/03/23 1520 03/06/23 0921       Objective:  Vital Signs  Vitals:   03/05/23 1644 03/05/23 2009 03/06/23 0555 03/06/23 0755  BP: 113/75 115/74 93/65 (!) 134/97  Pulse: 76 78 60 68  Resp: 18 18 18 18   Temp: 98.4 F (36.9 C) 97.7 F (36.5 C) 98.1 F (  36.7 C) 98.5 F (36.9 C)  TempSrc: Oral Oral  Oral  SpO2: 96% 97% 97% 98%  Weight:      Height:        Intake/Output Summary (Last 24 hours) at 03/06/2023 1120 Last data filed at 03/06/2023 0603 Gross per 24 hour  Intake 3679.25 ml  Output --  Net 3679.25 ml   Filed Weights   03/03/23 1404 03/04/23 0543 03/05/23 0500  Weight: 83.4 kg 80.8 kg 84.7 kg    General appearance: Awake alert.  In no distress Resp: Clear to  auscultation bilaterally.  Normal effort Cardio: S1-S2 is normal regular.  No S3-S4.  No rubs murmurs or bruit GI: Abdomen is soft.  Nontender nondistended.  Bowel sounds are present normal.  No masses organomegaly Extremities: No edema.  Full range of motion of lower extremities. Neurologic: Alert and oriented x3.  No focal neurological deficits.    Lab Results:  Data Reviewed: I have personally reviewed following labs and reports of the imaging studies  CBC: Recent Labs  Lab 03/03/23 1412 03/04/23 0547 03/05/23 0206 03/06/23 0002  WBC 4.3 4.4 4.9 4.7  NEUTROABS  --  2.3 2.3  --   HGB 18.2* 15.8 15.0 14.5  HCT 49.5 44.0 42.4 42.2  MCV 84.5 84.3 87.8 87.2  PLT 168 149* 164 170    Basic Metabolic Panel: Recent Labs  Lab 03/03/23 1412 03/04/23 0547 03/05/23 0206 03/06/23 0002  NA 130* 131* 134* 137  K 3.0* 2.8* 3.6 3.6  CL 94* 96* 102 106  CO2 25 26 23 22   GLUCOSE 148* 152* 141* 113*  BUN 13 11 13 12   CREATININE 1.01 0.82 0.83 0.73  CALCIUM 9.3 8.2* 8.2* 8.2*  MG  --  2.2 1.9  --     GFR: Estimated Creatinine Clearance: 100 mL/min (by C-G formula based on SCr of 0.73 mg/dL).  Liver Function Tests: Recent Labs  Lab 03/03/23 1412 03/04/23 0547 03/05/23 0206 03/06/23 0002  AST 41 268* 77* 43*  ALT 47* 228* 154* 114*  ALKPHOS 46 86 65 62  BILITOT 0.6 0.8 0.3 0.3  PROT 7.9 6.4* 6.0* 5.9*  ALBUMIN 4.2 2.8* 2.7* 2.6*    Recent Labs  Lab 03/03/23 1412  LIPASE 20    HbA1C: Recent Labs    03/04/23 1036  HGBA1C 6.0*     Recent Results (from the past 240 hour(s))  Urine Culture     Status: Abnormal   Collection Time: 02/28/23  9:39 AM   Specimen: Urine  Result Value Ref Range Status   MICRO NUMBER: [id number]  Final   SPECIMEN QUALITY: Adequate  Final   Sample Source URINE  Final   STATUS: FINAL  Final   ISOLATE 1: ESBL Escherichia coli (A)  Final    Comment: Greater than 100,000 CFU/mL of Escherichia coli (ESBL) ESBL RESULT:        The organism  has been confirmed as an ESBL producer.      Susceptibility   Esbl escherichia coli - URINE CULTURE, REFLEX    AMOX/CLAVULANIC 4 Sensitive     AMPICILLIN* >=32 Resistant      * Extended spectrum beta-lactamase (ESBL) producing organisms demonstrate decreased activity with penicillins, cephalosporins and aztreonam.     AMPICILLIN/SULBACTAM 4 Sensitive     CEFAZOLIN* >=64 Resistant      * Extended spectrum beta-lactamase (ESBL) producing organisms demonstrate decreased activity with penicillins, cephalosporins and aztreonam. For uncomplicated UTI caused by E. coli, K. pneumoniae or P. mirabilis:  Cefazolin is susceptible if MIC <32 mcg/mL and predicts susceptible to the oral agents cefaclor, cefdinir, cefpodoxime, cefprozil, cefuroxime, cephalexin and loracarbef.     CEFTAZIDIME 4 Sensitive     CEFEPIME <=1 Sensitive     CEFTRIAXONE 16 Resistant     CIPROFLOXACIN >=4 Resistant     LEVOFLOXACIN >=8 Resistant     GENTAMICIN <=1 Sensitive     IMIPENEM <=0.25 Sensitive     NITROFURANTOIN <=16 Sensitive     PIP/TAZO <=4 Sensitive     TOBRAMYCIN <=1 Sensitive     TRIMETH/SULFA* >=320 Resistant      * Extended spectrum beta-lactamase (ESBL) producing organisms demonstrate decreased activity with penicillins, cephalosporins and aztreonam. For uncomplicated UTI caused by E. coli, K. pneumoniae or P. mirabilis: Cefazolin is susceptible if MIC <32 mcg/mL and predicts susceptible to the oral agents cefaclor, cefdinir, cefpodoxime, cefprozil, cefuroxime, cephalexin and loracarbef. Legend: S = Susceptible  I = Intermediate R = Resistant  NS = Not susceptible * = Not tested  NR = Not reported **NN = See antimicrobic comments   Resp panel by RT-PCR (RSV, Flu A&B, Covid) Anterior Nasal Swab     Status: None   Collection Time: 03/03/23  2:12 PM   Specimen: Anterior Nasal Swab  Result Value Ref Range Status   SARS Coronavirus 2 by RT PCR NEGATIVE NEGATIVE Final    Comment:  (NOTE) SARS-CoV-2 target nucleic acids are NOT DETECTED.  The SARS-CoV-2 RNA is generally detectable in upper respiratory specimens during the acute phase of infection. The lowest concentration of SARS-CoV-2 viral copies this assay can detect is 138 copies/mL. A negative result does not preclude SARS-Cov-2 infection and should not be used as the sole basis for treatment or other patient management decisions. A negative result may occur with  improper specimen collection/handling, submission of specimen other than nasopharyngeal swab, presence of viral mutation(s) within the areas targeted by this assay, and inadequate number of viral copies(<138 copies/mL). A negative result must be combined with clinical observations, patient history, and epidemiological information. The expected result is Negative.  Fact Sheet for Patients:  BloggerCourse.com  Fact Sheet for Healthcare Providers:  SeriousBroker.it  This test is no t yet approved or cleared by the [place] FDA and  has been authorized for detection and/or diagnosis of SARS-CoV-2 by FDA under an Emergency Use Authorization (EUA). This EUA will remain  in effect (meaning this test can be used) for the duration of the COVID-19 declaration under Section 564(b)(1) of the Act, 21 U.S.C.section 360bbb-3(b)(1), unless the authorization is terminated  or revoked sooner.       Influenza A by PCR NEGATIVE NEGATIVE Final   Influenza B by PCR NEGATIVE NEGATIVE Final    Comment: (NOTE) The Xpert Xpress SARS-CoV-2/FLU/RSV plus assay is intended as an aid in the diagnosis of influenza from Nasopharyngeal swab specimens and should not be used as a sole basis for treatment. Nasal washings and aspirates are unacceptable for Xpert Xpress SARS-CoV-2/FLU/RSV testing.  Fact Sheet for Patients: BloggerCourse.com  Fact Sheet for Healthcare  Providers: SeriousBroker.it  This test is not yet approved or cleared by the [place] FDA and has been authorized for detection and/or diagnosis of SARS-CoV-2 by FDA under an Emergency Use Authorization (EUA). This EUA will remain in effect (meaning this test can be used) for the duration of the COVID-19 declaration under Section 564(b)(1) of the Act, 21 U.S.C. section 360bbb-3(b)(1), unless the authorization is terminated or revoked.     Resp Syncytial Virus  by PCR NEGATIVE NEGATIVE Final    Comment: (NOTE) Fact Sheet for Patients: BloggerCourse.com  Fact Sheet for Healthcare Providers: SeriousBroker.it  This test is not yet approved or cleared by the [place] FDA and has been authorized for detection and/or diagnosis of SARS-CoV-2 by FDA under an Emergency Use Authorization (EUA). This EUA will remain in effect (meaning this test can be used) for the duration of the COVID-19 declaration under Section 564(b)(1) of the Act, 21 U.S.C. section 360bbb-3(b)(1), unless the authorization is terminated or revoked.  Performed at [facility], [address], [place], [place] [address]   Blood culture (routine x 2)     Status: None (Preliminary result)   Collection Time: 03/03/23  6:02 PM   Specimen: BLOOD  Result Value Ref Range Status   Specimen Description   Final    BLOOD RIGHT ANTECUBITAL Performed at [facility], [address], [place], [place] [address]    Special Requests   Final    BOTTLES DRAWN AEROBIC AND ANAEROBIC Blood Culture adequate volume Performed at [facility], [address], [place], [place] [address]    Culture   Final    NO GROWTH 3 DAYS Performed at [facility], [address], [place], [place] [address]    Report Status PENDING  Incomplete  Blood culture (routine x 2)     Status: None (Preliminary  result)   Collection Time: 03/03/23  6:02 PM   Specimen: BLOOD LEFT HAND  Result Value Ref Range Status   Specimen Description   Final    BLOOD LEFT HAND Performed at [facility], [address], [place], [place] [address]    Special Requests   Final    BOTTLES DRAWN AEROBIC AND ANAEROBIC Blood Culture adequate volume Performed at [facility], [address], [place], [place] [address]    Culture   Final    NO GROWTH 3 DAYS Performed at [facility], [address], [place], [place] [address]    Report Status PENDING  Incomplete      Radiology Studies: US Abdomen Limited RUQ (LIVER/GB)  Result Date: 03/04/2023 CLINICAL DATA:  Liver injury EXAM: ULTRASOUND ABDOMEN LIMITED RIGHT UPPER QUADRANT COMPARISON:  CT 03/03/2023 FINDINGS: Gallbladder: Surgically absent Common bile duct: Diameter: 3 mm Liver: Echogenic hepatic parenchyma consistent with fatty liver infiltration. With this level of echogenicity evaluation for underlying mass lesion is limited and if needed contrast CT or MRI as clinically directed. Portal vein is patent on color Doppler imaging with normal direction of blood flow towards the liver. Other: None. IMPRESSION: Previous cholecystectomy. No ductal dilatation. Fatty liver infiltration. If there is further concern of liver abnormality a contrast study may be useful for further delineation when clinically appropriate Electronically Signed   By: Karen Kays M.D.   On: 03/04/2023 13:00       LOS: 2 days   Douglas Ford Rito Ehrlich  [facility] Pager on [email or web address]  03/06/2023, 11:20 AM

## 2023-03-06 NOTE — Progress Notes (Signed)
Diagnosis: Acute prostatitis, complicated uti  Culture Result: urine cx esbl ecoli  No Known Allergies  OPAT Orders Discharge antibiotics to be given via PICC line Discharge antibiotics: Ertapenem  Duration: 14 days from 7/29 End Date: OPAT Order Details             .Outpatient Parenteral Antibiotic Therapy Consult  Until discontinued       Provider:  (Not yet assigned)  Question Answer Comment  Antibiotic Ertapenem Pincus Sanes) IVPB   Indications for use ESBL UTI/prostatitis   End Date 03/17/2023   Approving ID Provider's Name Laurieann Friddle         .Outpatient Parenteral Antibiotic Therapy Consult  Until discontinued       Provider:  (Not yet assigned)  Question Answer Comment  Antibiotic Ertapenem Pincus Sanes) IVPB   Indications for use esbl   End Date 03/17/2023   Approving ID Provider's Name Curry Dulski                  [facility] Care Per Protocol:  Home health RN for IV administration and teaching; PICC line care and labs.    Labs weekly while on IV antibiotics: _x_ CBC with differential __ BMP _x_ CMP _x_ CRP __ ESR __ Vancomycin trough __ CK  __ Please pull PIC at completion of IV antibiotics _x_ Please leave PIC in place until doctor has seen patient or been notified  Fax weekly labs to [phone]  Clinic Follow Up Appt: 8/13 @ 2pm with Marcos Eke  @  [facility] [address], [place], [place] [address] Phone: [phone]   ------------------- Subjective: Doing better Afebrile No n/v/diarrhea    Objective: Vitals:   03/06/23 0555 03/06/23 0755  BP: 93/65 (!) 134/97  Pulse: 60 68  Resp: 18 18  Temp: 98.1 F (36.7 C) 98.5 F (36.9 C)  SpO2: 97% 98%   General/constitutional: no distress, pleasant HEENT: Normocephalic, PER, Conj Clear, EOMI, Oropharynx clear Neck supple CV: rrr no mrg Lungs: clear to auscultation, normal respiratory effort Abd: Soft, Nontender Ext: no edema Skin: No Rash Neuro: nonfocal MSK: no peripheral joint  swelling/tenderness/warmth; back spines nontender  Labs: Lab Results  Component Value Date   WBC 4.7 03/06/2023   HGB 14.5 03/06/2023   HCT 42.2 03/06/2023   MCV 87.2 03/06/2023   PLT 170 03/06/2023   Last metabolic panel Lab Results  Component Value Date   GLUCOSE 113 (H) 03/06/2023   NA 137 03/06/2023   K 3.6 03/06/2023   CL 106 03/06/2023   CO2 22 03/06/2023   BUN 12 03/06/2023   CREATININE 0.73 03/06/2023   GFRNONAA >60 03/06/2023   CALCIUM 8.2 (L) 03/06/2023   PROT 5.9 (L) 03/06/2023   ALBUMIN 2.6 (L) 03/06/2023   BILITOT 0.3 03/06/2023   ALKPHOS 62 03/06/2023   AST 43 (H) 03/06/2023   ALT 114 (H) 03/06/2023   ANIONGAP 9 03/06/2023

## 2023-03-06 NOTE — Plan of Care (Signed)
?  Problem: Clinical Measurements: ?Goal: Ability to maintain clinical measurements within normal limits will improve ?Outcome: Progressing ?Goal: Will remain free from infection ?Outcome: Progressing ?Goal: Diagnostic test results will improve ?Outcome: Progressing ?  ?

## 2023-03-06 NOTE — TOC Progression Note (Addendum)
Transition of Care [facility]) - Progression Note    Patient Details  Name: Douglas Ford MRN: [id number] Date of Birth: 07-03-1966  [facility]) CM/SW Contact  Lockie Pares, RN Phone Number: 03/06/2023, 12:33 PM  Clinical Narrative:    Called and  sent message to the patient regarding discharge planning.  [facility]) unable to accept for RN, regarding PICC line.  Will need Out of pocket, 150 weekly ( antibiotics for 2 weeks)  for RN to come and change dressing.   Will await return call to discuss. 1239 the patient called back and DC planning was discussed. Patient states if he cannot work, the cost may be prohibitive. He will talk to his husband and call this RNCM back.  1700 Called patient back to discuss cost of RN. Patient spouse answered phone and will discuss with husband, follow up in AM.  Expected Discharge Plan: Home/Self Care Barriers to Discharge: Continued Medical Work up  Expected Discharge Plan and Services    Home health RN to change dressing weekly.   Living arrangements for the past 2 months: Single Family Home                                       Social Determinants of Health (SDOH) Interventions SDOH Screenings   Food Insecurity: Food Insecurity Present (03/04/2023)  Housing: High Risk (03/04/2023)  Transportation Needs: No Transportation Needs (03/04/2023)  Utilities: Not At Risk (03/04/2023)  Depression (PHQ2-9): Low Risk  (02/28/2023)  Tobacco Use: Low Risk  (03/04/2023)    Readmission Risk Interventions     No data to display

## 2023-03-07 MED ORDER — ERTAPENEM IV (FOR PTA / DISCHARGE USE ONLY): 1.0000 g | INTRAVENOUS | 0 refills | Status: DC

## 2023-03-07 MED ORDER — SODIUM CHLORIDE 0.9% FLUSH
10.0000 mL | Freq: Two times a day (BID) | INTRAVENOUS | Status: DC
  Administered 2023-03-07: 10 mL

## 2023-03-07 MED ORDER — SODIUM CHLORIDE 0.9% FLUSH: 10.0000 mL | INTRAVENOUS | Status: DC | PRN

## 2023-03-07 MED ORDER — ERTAPENEM IV (FOR PTA / DISCHARGE USE ONLY): 1.0000 g | INTRAVENOUS | 0 refills | Status: AC

## 2023-03-07 MED ORDER — CHLORHEXIDINE GLUCONATE CLOTH 2 % EX PADS
6.0000 | MEDICATED_PAD | Freq: Every day | CUTANEOUS | Status: DC
  Administered 2023-03-07: 6 via TOPICAL

## 2023-03-07 NOTE — Progress Notes (Addendum)
Patient discharged.  Patient is leaving with PICC line.  Ivs removed by PICC team today when PICC inserted.    Reviewed discharge instructions, medications and follow up appts with patient.  Answered questions.  Gave patient copy of discharge instructions.    No additional questions or concerns at this time.   CNA wheeled patient down to car.

## 2023-03-07 NOTE — TOC Transition Note (Signed)
Transition of Care [facility]) - CM/SW Discharge Note   Patient Details  Name: Douglas Ford MRN: [id number] Date of Birth: [birth year]  Transition [facility] Care [facility]) CM/SW Contact:  Kermit Balo, RN Phone Number: 03/07/2023, 11:50 AM   Clinical Narrative:    Pt is discharging home with home health RN through [facility]. Pt is having to pay out of pocket for [facility] services and is aware of cost. He is in agreement.  Pt's spouse was educated on IV abx yesterday per Pam with [employer].  Pt has transportation home.   Final next level of care: Home w Home Health Services Barriers to Discharge: Inadequate or no insurance, Barriers Unresolved (comment)   Patient Goals and CMS Choice      Discharge Placement                         Discharge Plan and Services Additional resources added to the After Visit Summary for                                       Social Determinants of Health (SDOH) Interventions SDOH Screenings   Food Insecurity: Food Insecurity Present (03/04/2023)  Housing: High Risk (03/04/2023)  Transportation Needs: No Transportation Needs (03/04/2023)  Utilities: Not At Risk (03/04/2023)  Depression (PHQ2-9): Low Risk  (02/28/2023)  Tobacco Use: Low Risk  (03/04/2023)     Readmission Risk Interventions     No data to display

## 2023-03-07 NOTE — Discharge Summary (Signed)
[facility]  Physician Discharge Summary   Patient ID: Douglas Ford MRN: [id number] DOB/AGE: [birth year] 57 y.o.  Admit date: 03/03/2023 Discharge date: 03/07/2023    PCP: Shelva Majestic, MD  DISCHARGE DIAGNOSES:    UTI due to extended-spectrum beta lactamase (ESBL) producing Escherichia coli   Prostatitis   RECOMMENDATIONS FOR OUTPATIENT FOLLOW UP: ID to arrange outpatient follow-up Follow-up liver function tests in a few weeks.   Home Health: Home health RN for home IV antibiotics Equipment/Devices: None  CODE STATUS: Full code  DISCHARGE CONDITION: fair  Diet recommendation: As before  INITIAL HISTORY:  57 y.o. male with medical history significant for hypertension presenting with fevers, chills, and abdominal pain with diagnosis of prostatitis failing outpatient treatment.  Patient was hospitalized for further management.  Found to have ESBL E. coli infection.   Consultants: Infectious disease   Procedures:  PICC line placement   HOSPITAL COURSE:   Acute pyelonephritis/acute prostatitis/ESBL UTI Urine culture from 7/26 was positive for ESBL E. coli. CT scan on 7/29 showed mild acute prostatitis.  Concern was also raised for pyelonephritis. Blood cultures negative.  Seen by infectious disease.  They recommend prolonged IV antibiotics to 03/17/2023.  Changed over to ertapenem.  PICC line was placed.  Home health has been arranged.  Symptomatically has improved.  Okay for discharge.   Abnormal LFTs Elevated AST and ALT level noted.  No abnormality noted on recent CT scan.  Status post previous cholecystectomy.  Underwent ultrasound which showed fatty liver.  No ductal dilatation. Possibly due to acute illness.  LFTs are trending down.  Hepatitis panel is negative.  Abdomen remains benign.  Can be further managed in the outpatient setting.   Hypokalemia Repleted.   Hyponatremia Improved   Essential hypertension Resume home medications    Hyperglycemia HbA1c is 6.0.  Outpatient management.   Obesity Estimated body mass index is 32.05 kg/m as calculated from the following:   Height as of this encounter: 5\' 4"  (1.626 m).   Weight as of this encounter: 84.7 kg.  Patient is stable.  Okay for discharge home today.   PERTINENT LABS:  The results of significant diagnostics from this hospitalization (including imaging, microbiology, ancillary and laboratory) are listed below for reference.    Microbiology: Recent Results (from the past 240 hour(s))  Urine Culture     Status: Abnormal   Collection Time: 02/28/23  9:39 AM   Specimen: Urine  Result Value Ref Range Status   MICRO NUMBER: [id number]  Final   SPECIMEN QUALITY: Adequate  Final   Sample Source URINE  Final   STATUS: FINAL  Final   ISOLATE 1: ESBL Escherichia coli (A)  Final    Comment: Greater than 100,000 CFU/mL of Escherichia coli (ESBL) ESBL RESULT:        The organism has been confirmed as an ESBL producer.      Susceptibility   Esbl escherichia coli - URINE CULTURE, REFLEX    AMOX/CLAVULANIC 4 Sensitive     AMPICILLIN* >=32 Resistant      * Extended spectrum beta-lactamase (ESBL) producing organisms demonstrate decreased activity with penicillins, cephalosporins and aztreonam.     AMPICILLIN/SULBACTAM 4 Sensitive     CEFAZOLIN* >=64 Resistant      * Extended spectrum beta-lactamase (ESBL) producing organisms demonstrate decreased activity with penicillins, cephalosporins and aztreonam. For uncomplicated UTI caused by E. coli, K. pneumoniae or P. mirabilis: Cefazolin is susceptible if MIC <32 mcg/mL and predicts susceptible to the oral agents cefaclor, cefdinir, cefpodoxime,  cefprozil, cefuroxime, cephalexin and loracarbef.     CEFTAZIDIME 4 Sensitive     CEFEPIME <=1 Sensitive     CEFTRIAXONE 16 Resistant     CIPROFLOXACIN >=4 Resistant     LEVOFLOXACIN >=8 Resistant     GENTAMICIN <=1 Sensitive     IMIPENEM <=0.25 Sensitive      NITROFURANTOIN <=16 Sensitive     PIP/TAZO <=4 Sensitive     TOBRAMYCIN <=1 Sensitive     TRIMETH/SULFA* >=320 Resistant      * Extended spectrum beta-lactamase (ESBL) producing organisms demonstrate decreased activity with penicillins, cephalosporins and aztreonam. For uncomplicated UTI caused by E. coli, K. pneumoniae or P. mirabilis: Cefazolin is susceptible if MIC <32 mcg/mL and predicts susceptible to the oral agents cefaclor, cefdinir, cefpodoxime, cefprozil, cefuroxime, cephalexin and loracarbef. Legend: S = Susceptible  I = Intermediate R = Resistant  NS = Not susceptible * = Not tested  NR = Not reported **NN = See antimicrobic comments   Resp panel by RT-PCR (RSV, Flu A&B, Covid) Anterior Nasal Swab     Status: None   Collection Time: 03/03/23  2:12 PM   Specimen: Anterior Nasal Swab  Result Value Ref Range Status   SARS Coronavirus 2 by RT PCR NEGATIVE NEGATIVE Final    Comment: (NOTE) SARS-CoV-2 target nucleic acids are NOT DETECTED.  The SARS-CoV-2 RNA is generally detectable in upper respiratory specimens during the acute phase of infection. The lowest concentration of SARS-CoV-2 viral copies this assay can detect is 138 copies/mL. A negative result does not preclude SARS-Cov-2 infection and should not be used as the sole basis for treatment or other patient management decisions. A negative result may occur with  improper specimen collection/handling, submission of specimen other than nasopharyngeal swab, presence of viral mutation(s) within the areas targeted by this assay, and inadequate number of viral copies(<138 copies/mL). A negative result must be combined with clinical observations, patient history, and epidemiological information. The expected result is Negative.  Fact Sheet for Patients:  BloggerCourse.com  Fact Sheet for Healthcare Providers:  SeriousBroker.it  This test is no t yet approved or  cleared by the [place] FDA and  has been authorized for detection and/or diagnosis of SARS-CoV-2 by FDA under an Emergency Use Authorization (EUA). This EUA will remain  in effect (meaning this test can be used) for the duration of the COVID-19 declaration under Section 564(b)(1) of the Act, 21 U.S.C.section 360bbb-3(b)(1), unless the authorization is terminated  or revoked sooner.       Influenza A by PCR NEGATIVE NEGATIVE Final   Influenza B by PCR NEGATIVE NEGATIVE Final    Comment: (NOTE) The Xpert Xpress SARS-CoV-2/FLU/RSV plus assay is intended as an aid in the diagnosis of influenza from Nasopharyngeal swab specimens and should not be used as a sole basis for treatment. Nasal washings and aspirates are unacceptable for Xpert Xpress SARS-CoV-2/FLU/RSV testing.  Fact Sheet for Patients: BloggerCourse.com  Fact Sheet for Healthcare Providers: SeriousBroker.it  This test is not yet approved or cleared by the [place] FDA and has been authorized for detection and/or diagnosis of SARS-CoV-2 by FDA under an Emergency Use Authorization (EUA). This EUA will remain in effect (meaning this test can be used) for the duration of the COVID-19 declaration under Section 564(b)(1) of the Act, 21 U.S.C. section 360bbb-3(b)(1), unless the authorization is terminated or revoked.     Resp Syncytial Virus by PCR NEGATIVE NEGATIVE Final    Comment: (NOTE) Fact Sheet for Patients: BloggerCourse.com  Fact  Sheet for Healthcare Providers: SeriousBroker.it  This test is not yet approved or cleared by the [place] and has been authorized for detection and/or diagnosis of SARS-CoV-2 by FDA under an Emergency Use Authorization (EUA). This EUA will remain in effect (meaning this test can be used) for the duration of the COVID-19 declaration under Section 564(b)(1) of the Act, 21  U.S.C. section 360bbb-3(b)(1), unless the authorization is terminated or revoked.  Performed at [facility], [address], [place], [place] [address]   Blood culture (routine x 2)     Status: None   Collection Time: 03/03/23  6:02 PM   Specimen: BLOOD  Result Value Ref Range Status   Specimen Description   Final    BLOOD RIGHT ANTECUBITAL Performed at [facility], [address], [place], [place] [address]    Special Requests   Final    BOTTLES DRAWN AEROBIC AND ANAEROBIC Blood Culture adequate volume Performed at [facility], [address], [place], [place] [address]    Culture   Final    NO GROWTH 5 DAYS Performed at [facility], [address] [place], [place] [address]    Report Status 03/08/2023 FINAL  Final  Blood culture (routine x 2)     Status: None   Collection Time: 03/03/23  6:02 PM   Specimen: BLOOD LEFT HAND  Result Value Ref Range Status   Specimen Description   Final    BLOOD LEFT HAND Performed at [facility], [address] [place], [place] [address]    Special Requests   Final    BOTTLES DRAWN AEROBIC AND ANAEROBIC Blood Culture adequate volume Performed at [facility], [address] [place], [place] [address]    Culture   Final    NO GROWTH 5 DAYS Performed at [facility], [address] [place], [place] [address]    Report Status 03/08/2023 FINAL  Final     Labs:   Basic Metabolic Panel: Recent Labs  Lab 03/03/23 1412 03/04/23 0547 03/05/23 0206 03/06/23 0002 03/06/23 2358  NA 130* 131* 134* 137 136  K 3.0* 2.8* 3.6 3.6 3.8  CL 94* 96* 102 106 105  CO2 25 26 23 22 24   GLUCOSE 148* 152* 141* 113* 117*  BUN 13 11 13 12 14   CREATININE 1.01 0.82 0.83 0.73 0.73  CALCIUM 9.3 8.2* 8.2* 8.2* 8.4*  MG  --  2.2 1.9  --   --    Liver Function Tests: Recent Labs  Lab 03/03/23 1412 03/04/23 0547 03/05/23 0206 03/06/23 0002  03/06/23 2358  AST 41 268* 77* 43* 58*  ALT 47* 228* 154* 114* 115*  ALKPHOS 46 86 65 62 57  BILITOT 0.6 0.8 0.3 0.3 <0.1*  PROT 7.9 6.4* 6.0* 5.9* 5.7*  ALBUMIN 4.2 2.8* 2.7* 2.6* 2.6*   Recent Labs  Lab 03/03/23 1412  LIPASE 20    CBC: Recent Labs  Lab 03/03/23 1412 03/04/23 0547 03/05/23 0206 03/06/23 0002 03/06/23 2358  WBC 4.3 4.4 4.9 4.7 4.2  NEUTROABS  --  2.3 2.3  --   --   HGB 18.2* 15.8 15.0 14.5 14.1  HCT 49.5 44.0 42.4 42.2 40.7  MCV 84.5 84.3 87.8 87.2 86.4  PLT 168 149* 164 170 197     IMAGING STUDIES [place] EKG SITE RITE  Result Date: 03/06/2023 If Site Rite image not attached, placement could not be confirmed due to current cardiac rhythm.  US Abdomen Limited RUQ (LIVER/GB)  Result Date: 03/04/2023 CLINICAL DATA:  Liver injury EXAM: ULTRASOUND ABDOMEN LIMITED RIGHT UPPER QUADRANT COMPARISON:  CT 03/03/2023 FINDINGS: Gallbladder: Surgically absent Common bile duct: Diameter: 3 mm Liver: Echogenic hepatic parenchyma consistent with fatty liver infiltration. With this level of echogenicity evaluation for underlying mass lesion is limited and if needed contrast CT or MRI as clinically directed. Portal vein is patent on color Doppler imaging with normal direction of blood flow towards the liver. Other: None. IMPRESSION: Previous cholecystectomy. No ductal dilatation. Fatty liver infiltration. If there is further concern of liver abnormality a contrast study may be useful for further delineation when clinically appropriate Electronically Signed   By: Karen Kays M.D.   On: 03/04/2023 13:00   CT Renal Stone Study  Result Date: 03/03/2023 CLINICAL DATA:  Fever. EXAM: CT ABDOMEN AND PELVIS WITHOUT CONTRAST TECHNIQUE: Multidetector CT imaging of the abdomen and pelvis was performed following the standard protocol without IV contrast. RADIATION DOSE REDUCTION: This exam was performed according to the departmental dose-optimization program which includes automated exposure  control, adjustment of the mA and/or kV according to patient size and/or use of iterative reconstruction technique. COMPARISON:  None Available. FINDINGS: Lower chest: Mild linear atelectasis is seen within the right lung base. Hepatobiliary: No focal liver abnormality is seen. Status post cholecystectomy. No biliary dilatation. Pancreas: Unremarkable. No pancreatic ductal dilatation or surrounding inflammatory changes. Spleen: Normal in size without focal abnormality. Adrenals/Urinary Tract: Adrenal glands are unremarkable. Kidneys are normal in size, without renal calculi, focal lesion, or hydronephrosis. There is mild bilateral perinephric inflammatory fat stranding, right greater than left. Bladder is unremarkable. Stomach/Bowel: There is a small hiatal hernia. Appendix appears normal. No evidence of bowel wall thickening, distention, or inflammatory changes. Vascular/Lymphatic: No significant vascular findings are present. No enlarged abdominal or pelvic lymph nodes. Reproductive: There is mild to moderate severity prostate gland enlargement. A mild amount of surrounding inflammatory fat stranding is seen. Other: No abdominal wall hernia or abnormality. No abdominopelvic ascites. Musculoskeletal: No acute or significant osseous findings. IMPRESSION: 1. Mild acute prostatitis. 2. Findings which may represent sequelae associated with acute pyelonephritis. Correlation with urinalysis is recommended. 3. Small hiatal hernia. 4. Evidence of prior cholecystectomy. Electronically Signed   By: Aram Candela M.D.   On: 03/03/2023 20:38   DG Chest 2 View  Result Date: 03/03/2023 CLINICAL DATA:  Chills and occasional cough. EXAM: CHEST - 2 VIEW COMPARISON:  June 04, 2004 FINDINGS: The heart size and mediastinal contours are within normal limits. Low lung volumes are noted with mild elevation of the right hemidiaphragm. Very mild right basilar atelectasis and/or infiltrate is seen. No pleural effusion or  pneumothorax is identified. Radiopaque surgical clips are present within the right upper quadrant. The visualized skeletal structures are unremarkable. IMPRESSION: Low lung volumes with very mild right basilar atelectasis and/or infiltrate. Electronically Signed   By: Aram Candela M.D.   On: 03/03/2023 15:28    DISCHARGE EXAMINATION: Vitals:   03/06/23 1940 03/07/23 0344 03/07/23 0500 03/07/23 0735  BP: 122/82 110/76  119/83  Pulse: 78 68  64  Resp: 18 18  16   Temp: 97.9 F (36.6 C) 98.4 F (36.9 C)  98 F (36.7 C)  TempSrc: Oral   Oral  SpO2: 98% 98%  99%  Weight:   82.7 kg   Height:       General appearance: Awake alert.  In no distress Resp: Clear to auscultation bilaterally.  Normal effort Cardio: S1-S2 is  normal regular.  No S3-S4.  No rubs murmurs or bruit GI: Abdomen is soft.  Nontender nondistended.  Bowel sounds are present normal.  No masses organomegaly   DISPOSITION: Home  Discharge Instructions     [facility] pharmacist to adjust dose for Vancomycin, Aminoglycosides and other anti-infective therapies as requested by physician.   Complete by: As directed    [facility] to provide Cath Flo 2mg    Complete by: As directed    Administer for PICC line occlusion and as ordered by physician for other access device issues.   Anaphylaxis Kit: Provided to treat any anaphylactic reaction to the medication being provided to the patient if First Dose or when requested by physician   Complete by: As directed    Epinephrine 1mg /ml vial / amp: Administer 0.3mg  (0.8ml) subcutaneously once for moderate to severe anaphylaxis, nurse to call physician and pharmacy when reaction occurs and call 911 if needed for immediate care   Diphenhydramine 50mg /ml IV vial: Administer 25-50mg  IV/IM PRN for first dose reaction, rash, itching, mild reaction, nurse to call physician and pharmacy when reaction occurs   Sodium Chloride 0.9% NS IV: Administer if needed for  hypovolemic blood pressure drop or as ordered by physician after call to physician with anaphylactic reaction   Call MD for:  difficulty breathing, headache or visual disturbances   Complete by: As directed    Call MD for:  extreme fatigue   Complete by: As directed    Call MD for:  persistant dizziness or light-headedness   Complete by: As directed    Call MD for:  persistant nausea and vomiting   Complete by: As directed    Call MD for:  severe uncontrolled pain   Complete by: As directed    Call MD for:  temperature >100.4   Complete by: As directed    Change dressing on IV access line weekly and PRN   Complete by: As directed    Diet - low sodium heart healthy   Complete by: As directed    Discharge instructions   Complete by: As directed    Please take your medications as prescribed.  Please be sure to follow-up with the primary care provider.  You were cared for by a hospitalist during your hospital stay. If you have any questions about your discharge medications or the care you received while you were in the hospital after you are discharged, you can call the unit and asked to speak with the hospitalist on call if the hospitalist that took care of you is not available. Once you are discharged, your primary care physician will handle any further medical issues. Please note that NO REFILLS for any discharge medications will be authorized once you are discharged, as it is imperative that you return to your primary care physician (or establish a relationship with a primary care physician if you do not have one) for your aftercare needs so that they can reassess your need for medications and monitor your lab values. If you do not have a primary care physician, you can call [phone] for a physician referral.   Flush IV access with Sodium Chloride 0.9% and Heparin 10 units/ml or 100 units/ml   Complete by: As directed    Home infusion instructions - [facility]   Complete by: As  directed    Instructions: Flush IV access with Sodium Chloride 0.9% and Heparin 10units/ml or 100units/ml   Change dressing on IV access line: Weekly  and PRN   Instructions Cath Flo 2mg : Administer for PICC Line occlusion and as ordered by physician for other access device   [facility] pharmacist to adjust dose for: Vancomycin, Aminoglycosides and other anti-infective therapies as requested by physician   Increase activity slowly   Complete by: As directed    Method of administration may be changed at the discretion of home infusion pharmacist based upon assessment of the patient and/or caregiver's ability to self-administer the medication ordered   Complete by: As directed    No wound care   Complete by: As directed          Allergies as of 03/07/2023   No Known Allergies      Medication List     STOP taking these medications    amoxicillin-clavulanate 875-125 MG tablet Commonly known as: AUGMENTIN   sulfamethoxazole-trimethoprim 800-160 MG tablet Commonly known as: BACTRIM DS       TAKE these medications    acetaminophen 325 MG tablet Commonly known as: TYLENOL Take 650 mg by mouth every 6 (six) hours as needed for moderate pain or headache.   amLODipine 5 MG tablet Commonly known as: NORVASC Take 1 tablet (5 mg total) by mouth daily.   ertapenem IVPB Commonly known as: INVANZ Inject 1 g into the vein daily for 10 days. Indication:  ESBL UTI/prostatitis First Dose: Yes Last Day of Therapy:  03/17/23 Labs - Once weekly:  CBC/D, CMP, CRP Method of administration: Mini-Bag Plus / Gravity Please leave PIC in place until doctor has seen patient or been notified Method of administration may be changed at the discretion of home infusion pharmacist based upon assessment of the patient and/or caregiver's ability to self-administer the medication ordered.   lisinopril-hydrochlorothiazide 20-25 MG tablet Commonly known as: ZESTORETIC Take 1 tablet by mouth  daily.               Discharge Care Instructions  (From admission, onward)           Start     Ordered   03/07/23 0000  Change dressing on IV access line weekly and PRN  (Home infusion instructions - [facility] )        03/07/23 1013              Follow-up Information     [facility] Follow up.   Why: Infusion company        [facility] Follow up.   Contact information: RN serevice for infusion dressing changes once weekly.        Veryl Speak, FNP Follow up on 03/18/2023.   Specialty: Infectious Diseases Why: on 03/18/23 at 2pm Contact information: [address] [employer] [address] [place] [address] [phone]                 TOTAL DISCHARGE TIME: 35 minutes  Janzen Sacks Rito Ehrlich  [facility] Pager on [email or web address]  03/08/2023, 11:29 AM

## 2023-03-07 NOTE — Progress Notes (Signed)
Peripherally Inserted Central Catheter Placement  The IV Nurse has discussed with the patient and/or persons authorized to consent for the patient, the purpose of this procedure and the potential benefits and risks involved with this procedure.  The benefits include less needle sticks, lab draws from the catheter, and the patient may be discharged home with the catheter. Risks include, but not limited to, infection, bleeding, blood clot (thrombus formation), and puncture of an artery; nerve damage and irregular heartbeat and possibility to perform a PICC exchange if needed/ordered by physician.  Alternatives to this procedure were also discussed.  Bard Power PICC patient education guide, fact sheet on infection prevention and patient information card has been provided to patient /or left at bedside.    PICC Placement Documentation  PICC Single Lumen 03/07/23 Right Basilic 38 cm 0 cm (Active)  Indication for Insertion or Continuance of Line Home intravenous therapies (PICC only) 03/07/23 0800  Exposed Catheter (cm) 0 cm 03/07/23 0800  Site Assessment Clean, Dry, Intact 03/07/23 0800  Line Status Flushed;Saline locked;Blood return noted 03/07/23 0800  Dressing Type Transparent;Securing device 03/07/23 0800  Dressing Status Antimicrobial disc in place;Clean, Dry, Intact 03/07/23 0800  Safety Lock Not Applicable 03/07/23 0800  Line Care Connections checked and tightened 03/07/23 0800  Line Adjustment (NICU/IV Team Only) No 03/07/23 0800  Dressing Intervention New dressing 03/07/23 0800  Dressing Change Due 03/14/23 03/07/23 0800       Franne Grip Renee 03/07/2023, 8:38 AM

## 2023-03-11 ENCOUNTER — Telehealth: Payer: Self-pay

## 2023-03-11 NOTE — Transitions of Care (Post Inpatient/ED Visit) (Signed)
   03/11/2023  Name: Douglas Ford MRN: [id number] DOB: [birth year]  Today's TOC FU Call Status: Today's TOC FU Call Status:: Unsuccessful Call (1st Attempt) Unsuccessful Call (1st Attempt) Date: 03/11/23  Attempted to reach the patient regarding the most recent Inpatient/ED visit.  Follow Up Plan: Additional outreach attempts will be made to reach the patient to complete the Transitions of Care (Post Inpatient/ED visit) call.   Signature Karena Addison, LPN [facility] Nurse Health Advisor Direct Dial [phone]

## 2023-03-11 NOTE — Transitions of Care (Post Inpatient/ED Visit) (Signed)
   03/11/2023  Name: Douglas Ford MRN: [id number] DOB: [birth year]  Today's TOC FU Call Status: Today's TOC FU Call Status:: Successful TOC FU Call Completed Unsuccessful Call (1st Attempt) Date: 03/11/23 [facility] FU Call Complete Date: 03/11/23  Transition Care Management Follow-up Telephone Call Date of Discharge: 03/07/23 Discharge Facility: [facility]) Type of Discharge: Inpatient Admission Primary Inpatient Discharge Diagnosis:: nephritis How have you been since you were released from the hospital?: Better Any questions or concerns?: No  Items Reviewed: Did you receive and understand the discharge instructions provided?: No Medications obtained,verified, and reconciled?: Yes (Medications Reviewed) Any new allergies since your discharge?: No Dietary orders reviewed?: Yes Do you have support at home?: Yes People in Home: spouse  Medications Reviewed Today: Medications Reviewed Today   Medications were not reviewed in this encounter     Home Care and Equipment/Supplies: Were Home Health Services Ordered?: NA Any new equipment or medical supplies ordered?: NA  Functional Questionnaire: Do you need assistance with bathing/showering or dressing?: No Do you need assistance with meal preparation?: No Do you need assistance with eating?: No Do you have difficulty maintaining continence: No Do you need assistance with getting out of bed/getting out of a chair/moving?: No Do you have difficulty managing or taking your medications?: No  Follow up appointments reviewed: PCP Follow-up appointment confirmed?: NA Specialist Hospital Follow-up appointment confirmed?: Yes Date of Specialist follow-up appointment?: 03/18/23 Follow-Up Specialty Provider:: ID Do you need transportation to your follow-up appointment?: No Do you understand care options if your condition(s) worsen?: Yes-patient verbalized understanding    SIGNATURE Karena Addison, LPN [facility] Nurse Health  Advisor Direct Dial [phone]

## 2023-03-18 ENCOUNTER — Ambulatory Visit (INDEPENDENT_AMBULATORY_CARE_PROVIDER_SITE_OTHER): Payer: Self-pay | Admitting: Family

## 2023-03-18 ENCOUNTER — Encounter: Payer: Self-pay | Admitting: Family

## 2023-03-18 ENCOUNTER — Other Ambulatory Visit: Payer: Self-pay

## 2023-03-18 VITALS — BP 107/76 | HR 69 | Temp 97.4°F | Wt 185.0 lb

## 2023-03-18 DIAGNOSIS — N410 Acute prostatitis: Secondary | ICD-10-CM

## 2023-03-18 NOTE — Progress Notes (Unsigned)
   Subjective:    Patient ID: Douglas Ford, male    DOB: Mar 20, 1966, 57 y.o.   MRN: [id number]  Chief Complaint  Patient presents with   Follow-up    HPI:  Douglas Ford is a 57 y.o. male   Odor to urine; clear; no other pains; no problems with antibiotics. Having some trouble getting to sleep; no fevers;   No Known Allergies    Outpatient Medications Prior to Visit  Medication Sig Dispense Refill   acetaminophen (TYLENOL) 325 MG tablet Take 650 mg by mouth every 6 (six) hours as needed for moderate pain or headache.     amLODipine (NORVASC) 5 MG tablet Take 1 tablet (5 mg total) by mouth daily. 90 tablet 3   lisinopril-hydrochlorothiazide (ZESTORETIC) 20-25 MG tablet Take 1 tablet by mouth daily. 90 tablet 3   No facility-administered medications prior to visit.     Past Medical History:  Diagnosis Date   Hypertension    Lipoma of back 08/2014   ultrasound-confirmed     Past Surgical History:  Procedure Laterality Date   LAPAROSCOPIC CHOLECYSTECTOMY  2005   TONSILECTOMY, ADENOIDECTOMY, BILATERAL MYRINGOTOMY AND TUBES         Review of Systems    Objective:    BP 121/88   Pulse 68   Temp (!) 97.4 F (36.3 C) (Temporal)   Wt 185 lb (83.9 kg)   SpO2 97%   BMI 31.76 kg/m  Nursing note and vital signs reviewed.  Physical Exam      02/28/2023    8:36 AM 05/17/2022    3:23 PM 10/05/2020    1:27 PM  Depression screen PHQ 2/9  Decreased Interest 0 0 0  Down, Depressed, Hopeless 0 0 0  PHQ - 2 Score 0 0 0  Altered sleeping  0   Tired, decreased energy  0   Change in appetite  0   Feeling bad or failure about yourself   0   Trouble concentrating  0   Suicidal thoughts  0   PHQ-9 Score  0   Difficult doing work/chores  Not difficult at all        Assessment & Plan:    Patient Active Problem List   Diagnosis Date Noted   Prostatitis 03/04/2023   UTI due to extended-spectrum beta lactamase (ESBL) producing Escherichia coli 03/03/2023   Obesity  (BMI 30.0-34.9) 12/20/2017   GERD (gastroesophageal reflux disease) 09/03/2016   Headache 09/03/2016   HTN (hypertension), benign 08/30/2014   Subcutaneous mass 08/30/2014     Problem List Items Addressed This Visit   None    I am having [name] maintain his acetaminophen, amLODipine, and lisinopril-hydrochlorothiazide.   No orders of the defined types were placed in this encounter.    Follow-up: No follow-ups on file.   Marcos Eke, MSN, FNP-C Nurse Practitioner [facility] Main number: [phone]

## 2023-03-18 NOTE — Patient Instructions (Signed)
Nice to see you.  No additional antibiotics are needed at this time.   Continue to monitor for any return of symptoms.   If you need to be treated in the future can consider treatment with fosfomycin.   Follow up with ID as needed.   Have a great day and stay safe!

## 2023-03-18 NOTE — Progress Notes (Unsigned)
PICC Removal    PICC length & location:  right basilic 38 cm  Removed per verbal order from: Marcos Eke, NP  Blood thinners:  none Platelet count:  290 (03/17/23 [facility])  Site assessment: Dressing clean and dry. Extremity warm and dry. No drainage or swelling present at insertion site. Slight redness around insertion site approximately 0.5 cm diameter. Recommended patient keep an eye on the site and notify clinic if redness spreads or worsens or if drainage, red streaking, fever, or chills present.   Pre-removal vital signs:  BP:  121/88 HR:  68 SpO2:  97%  Insertion site positioned below level of heart. No sutures present. Insertion site cleaned with CHG, catheter removed and petroleum dressing applied. Tip intact. Pressure held until hemostasis achieved.    Length of catheter removed:  38 cm   Provided patient with after care instructions and precautions print out (via [employer] Clinical Key). Reviewed this information with patient.   Patient verbalized understanding and agreement, all questions answered. Patient tolerated procedure well and remained in clinic under the care of RN 30 minutes post removal.  Post-observation vital signs:  BP:  107/76 HR:  69 SpO2:  97%  Notified Jeri Modena, RN with [facility] and [facility] pharmacy team of removal.  Sandie Ano, RN

## 2023-03-19 ENCOUNTER — Encounter: Payer: Self-pay | Admitting: Family

## 2023-03-19 NOTE — Assessment & Plan Note (Signed)
Douglas Ford has competed his 2 weeks course of Ertapenem for ESBL E. Coli acute prostatitis/complicated UTI with resolution of his symptoms. Reviewed lab work and discussed plan of care that infection appears to be resolved at this point and no further antibiotics are needed. PICC line removed in clinic without complication (see note from Douglas Hoff, RN).  Advised to monitor for signs of recurrent infection. Can consider treatment with fosfomycin in the event of a UTI with suspected ESBL E. Coli. Happy to see Douglas Ford back as needed.

## 2023-05-09 ENCOUNTER — Other Ambulatory Visit: Payer: Self-pay | Admitting: Family Medicine

## 2023-08-05 ENCOUNTER — Other Ambulatory Visit: Payer: Self-pay | Admitting: Family Medicine

## 2023-08-26 ENCOUNTER — Ambulatory Visit: Payer: Self-pay | Admitting: Family Medicine

## 2023-08-26 ENCOUNTER — Ambulatory Visit (INDEPENDENT_AMBULATORY_CARE_PROVIDER_SITE_OTHER): Payer: Self-pay | Admitting: Internal Medicine

## 2023-08-26 ENCOUNTER — Telehealth: Payer: Self-pay | Admitting: *Deleted

## 2023-08-26 ENCOUNTER — Encounter: Payer: Self-pay | Admitting: Internal Medicine

## 2023-08-26 VITALS — BP 136/88 | HR 82 | Temp 97.9°F | Ht 64.0 in | Wt 183.8 lb

## 2023-08-26 DIAGNOSIS — Z5971 Insufficient health insurance coverage: Secondary | ICD-10-CM

## 2023-08-26 DIAGNOSIS — Z5987 Material hardship due to limited financial resources, not elsewhere classified: Secondary | ICD-10-CM

## 2023-08-26 DIAGNOSIS — R339 Retention of urine, unspecified: Secondary | ICD-10-CM

## 2023-08-26 DIAGNOSIS — N411 Chronic prostatitis: Secondary | ICD-10-CM

## 2023-08-26 DIAGNOSIS — R319 Hematuria, unspecified: Secondary | ICD-10-CM

## 2023-08-26 DIAGNOSIS — N410 Acute prostatitis: Secondary | ICD-10-CM

## 2023-08-26 LAB — CBC WITH DIFFERENTIAL/PLATELET
Basophils Absolute: 0 10*3/uL (ref 0.0–0.1)
Basophils Relative: 0.6 % (ref 0.0–3.0)
Eosinophils Absolute: 0.1 10*3/uL (ref 0.0–0.7)
Eosinophils Relative: 1.6 % (ref 0.0–5.0)
HCT: 46.9 % (ref 39.0–52.0)
Hemoglobin: 15.9 g/dL (ref 13.0–17.0)
Lymphocytes Relative: 23.7 % (ref 12.0–46.0)
Lymphs Abs: 1.7 10*3/uL (ref 0.7–4.0)
MCHC: 33.9 g/dL (ref 30.0–36.0)
MCV: 92 fL (ref 78.0–100.0)
Monocytes Absolute: 0.6 10*3/uL (ref 0.1–1.0)
Monocytes Relative: 7.9 % (ref 3.0–12.0)
Neutro Abs: 4.8 10*3/uL (ref 1.4–7.7)
Neutrophils Relative %: 66.2 % (ref 43.0–77.0)
Platelets: 177 10*3/uL (ref 150.0–400.0)
RBC: 5.1 Mil/uL (ref 4.22–5.81)
RDW: 13.8 % (ref 11.5–15.5)
WBC: 7.2 10*3/uL (ref 4.0–10.5)

## 2023-08-26 LAB — COMPREHENSIVE METABOLIC PANEL
ALT: 20 U/L (ref 0–53)
AST: 16 U/L (ref 0–37)
Albumin: 4.1 g/dL (ref 3.5–5.2)
Alkaline Phosphatase: 35 U/L — ABNORMAL LOW (ref 39–117)
BUN: 11 mg/dL (ref 6–23)
CO2: 26 meq/L (ref 19–32)
Calcium: 8.6 mg/dL (ref 8.4–10.5)
Chloride: 105 meq/L (ref 96–112)
Creatinine, Ser: 0.87 mg/dL (ref 0.40–1.50)
GFR: 95.35 mL/min (ref >60.00)
Glucose, Bld: 109 mg/dL — ABNORMAL HIGH (ref 70–99)
Potassium: 3.2 meq/L — ABNORMAL LOW (ref 3.5–5.1)
Sodium: 139 meq/L (ref 135–145)
Total Bilirubin: 0.6 mg/dL (ref 0.2–1.2)
Total Protein: 7 g/dL (ref 6.0–8.3)

## 2023-08-26 MED ORDER — AMOXICILLIN-POT CLAVULANATE 875-125 MG PO TABS
1.0000 | ORAL_TABLET | Freq: Two times a day (BID) | ORAL | 0 refills | Status: AC
Start: 2023-08-26 — End: ?

## 2023-08-26 MED ORDER — TAMSULOSIN HCL 0.4 MG PO CAPS
0.4000 mg | ORAL_CAPSULE | Freq: Every day | ORAL | 3 refills | Status: DC
Start: 2023-08-26 — End: 2023-12-16

## 2023-08-26 MED ORDER — PHENAZOPYRIDINE HCL 95 MG PO TABS
95.0000 mg | ORAL_TABLET | Freq: Three times a day (TID) | ORAL | 0 refills | Status: AC | PRN
Start: 2023-08-26 — End: ?

## 2023-08-26 NOTE — Progress Notes (Unsigned)
Complex Care Management Note Care Guide Note  08/26/2023 Name: Douglas Ford MRN: [id number] DOB: Sep 24, 1965   Complex Care Management Outreach Attempts: An unsuccessful telephone outreach was attempted today to offer the patient information about available complex care management services.  Follow Up Plan:  Additional outreach attempts will be made to offer the patient complex care management information and services.   Encounter Outcome:  No Answer  Burman Nieves, CMA, Care Guide [facility]  [employer] [facility] Guide Direct Dial: [phone]  Fax: [phone] Website: [email or web address]

## 2023-08-26 NOTE — Telephone Encounter (Signed)
FYI, pt scheduled today.

## 2023-08-26 NOTE — Patient Instructions (Signed)
VISIT SUMMARY:  During today's visit, we discussed your recurrent urinary symptoms, including difficulty urinating, blood in the urine, and discomfort in the kidney area. We reviewed your history of acute prostatitis and the treatments you have received. We also addressed your financial concerns and the impact on your health care.  YOUR PLAN:  -RECURRENT PROSTATITIS WITH ESBL E. COLI: Recurrent prostatitis is an inflammation of the prostate gland, often caused by a bacterial infection. In your case, the infection is due to ESBL E. Coli, which is resistant to many antibiotics. We will treat this with Augmentin twice daily for six weeks, Flomax to improve urine flow, and Pyridium for pain relief. It's important to drink plenty of fluids to help prevent blood clots. We will also do a urine culture to confirm the infection and refer you to a urologist for further management. If you are unable to urinate, go to the emergency room immediately.  -URINARY RETENTION: Urinary retention means you are unable to empty your bladder completely, which can be caused by blood clots from your prostatitis. This can lead to serious complications if not treated. We recommend drinking more fluids to help flush out the clots and taking Flomax to improve urine flow. If you cannot urinate, you should go to the emergency room for possible catheter placement and bladder flush.  -GENERAL HEALTH MAINTENANCE: We discussed your financial constraints and potential assistance programs. A social worker will help you explore options for medical cost assistance, including Medicaid and 211 services.  INSTRUCTIONS:  Please follow up with a urologist for further management of your condition. We have ordered blood work to check your kidney function and blood loss, as well as a urine culture to confirm the presence of ESBL E. Coli. If you are unable to urinate, go to the emergency room immediately. Contact a Child psychotherapist for assistance with  medical costs and explore potential financial assistance options, including Medicaid and 211 services.  Monitoring/Follow-up: - Earlier urology follow-up if:   - Clots increase in size/frequency   - Development of urinary retention   - Fever or systemic symptoms - Consider prophylactic antibiotics after discussing with urology if recurrences continue  Patient Education: - Increased fluid intake - Void when urged - Monitor temperature - Track frequency of clots - Return immediately if unable to void or if developing fever/chills

## 2023-08-26 NOTE — Telephone Encounter (Signed)
Copied from CRM [id number]. Topic: Clinical - Red Word Triage >> Aug 26, 2023  8:07 AM Douglas Ford wrote: Red Word that prompted transfer to Nurse Triage: Patient is calling with prostatitis symptoms, discomfort in the abdomen, blood in the urine, urge to urinate but is unable to.   Chief Complaint: Urinary symptoms Symptoms: Dysuria, urinary urgency, blood in urine Frequency: x 1 day Pertinent Negatives: Patient denies fevers Disposition: [] ED /[] Urgent Care (no appt availability in office) / [x] Appointment(In office/virtual)/ []  [facility]/ [] Home Care/ [] Refused Recommended Disposition /[] [facility]/ []  Follow-up with PCP Additional Notes: Patient's spouse called and advised that the patient had acute prostatitis in the late summer (around 5 months ago) and that started with difficulty urinating.  This episode started yesterday with blood in urine.  Patient's spouse states that they wanted to get him in as soon as possible to be able to get this taken care of before it becomes worse.  He advised that the patient is not in severe pain, more like a discomfort around 4-5 out of 10 on a pain scale.  He does not suspect dehydration in this patient at this time.  Appointment was made for today 08/26/2023 at 10:40am with Dr Glenetta Hew.  Patient's spouse is advised that if the patient gets worse before the appointment, to take him to the emergency room.  He verbalized understanding.  Reason for Disposition  All other urine symptoms  Answer Assessment - Initial Assessment Questions 1. SYMPTOM: "What's the main symptom you're concerned about?" (e.g., frequency, incontinence)     Dysuria and hematuria 2. ONSET: "When did the  symptoms  start?"     Yesterday 08/25/2023 3. PAIN: "Is there any pain?" If Yes, ask: "How bad is it?" (Scale: 1-10; mild, moderate, severe)     4-5 4. CAUSE: "What do you think is causing the symptoms?"     Possibly prostatitis 5. OTHER SYMPTOMS: "Do you  have any other symptoms?" (e.g., blood in urine, fever, flank pain, pain with urination)     Blood in urine  Protocols used: Urinary Symptoms-A-AH

## 2023-08-26 NOTE — Progress Notes (Unsigned)
==============================  [facility]: [phone]   -- Medical Office Visit --  Patient: Douglas Ford      Age: 58 y.o.       Sex:  male  Date:   08/26/2023 Today's Healthcare Provider: Lula Olszewski, MD  ==============================   CHIEF COMPLAINT: Difficulty with rinating (Kidneys feels sore also.), Hematuria (Has taken AZO about two weeks ago and sulfamethoxazole (once last night). ), and Dysuria (Burning also.) {{psa *** in future:1}{ Problem List as of 08/26/2023 Reviewed: 08/26/2023 11:13 AM by Donzetta Starch, CMA    Does not have health insurance   GERD (gastroesophageal reflux disease)   Last Assessment & Plan 12/19/2017 Office Visit Written 12/20/2017  7:00 PM by Shelva Majestic, MD  S: has not needed PPI or h2 blocker lately. Sparing tums A/P: continue current medications       Headache   Last Assessment & Plan 12/19/2017 Office Visit Written 12/20/2017  7:01 PM by Shelva Majestic, MD  S: headaches much improved. Only with intense heat while out working A/P: discussed staying hydrated- doesn't look like he will be able to avoid these wor situations       Hematuria   HTN (hypertension), benign   Last Assessment & Plan 12/19/2017 Office Visit Written 12/20/2017  7:01 PM by Shelva Majestic, MD  S: controlled on Lisinopril hct 20-25 mg, amlodipine 5mg . Drinks fair amount of sweet tea and discussed how that can make weight loss difficult.  BP Readings from Last 3 Encounters:  12/19/17 118/88  10/07/17 128/74  12/30/16 (!) 149/110  A/P: We discussed blood pressure goal of <140/90. Continue current meds:  We also discussed role of weight loss to further help him loewr diastolic BP. He needs to schedule a physical within the next few months and we can update labs        Material hardship due to limited financial resources   Obesity (BMI 30.0-34.9)   Last Assessment & Plan 12/19/2017 Office Visit Written 12/20/2017   7:03 PM by Shelva Majestic, MD  S: weight trending up Wt Readings from Last 3 Encounters:  12/19/17 184 lb 6.4 oz (83.6 kg)  10/07/17 184 lb 9.6 oz (83.7 kg)  10/15/16 176 lb (79.8 kg)  A/P: Encouraged need for healthy eating, regular exercise, weight loss.  His sweet tea drinking would be a low hanging target which could produce weight loss significantly      Prostatitis   Last Assessment & Plan 03/18/2023 Office Visit Written 03/19/2023  9:09 AM by Veryl Speak, FNP  Rhoen has competed his 2 weeks course of Ertapenem for ESBL E. Coli acute prostatitis/complicated UTI with resolution of his symptoms. Reviewed lab work and discussed plan of care that infection appears to be resolved at this point and no further antibiotics are needed. PICC line removed in clinic without complication (see note from Linna Hoff, RN).  Advised to monitor for signs of recurrent infection. Can consider treatment with fosfomycin in the event of a UTI with suspected ESBL E. Coli. Happy to see Shuan back as needed.       Subcutaneous mass   Last Assessment & Plan 09/03/2016 Office Visit Written 09/03/2016  9:36 AM by Shelva Majestic, MD  S: round bump on his back that is sore. When stands up straight starts to bother him.determined to be a lipoma in past. Saw surgeon and preferred not to have surgery at the time.  O: 5 x  5 cm lipoma left low back to left of spine A/P: trial voltaren gel- avoid oral nsaids if able with HTN mild poor control. Discussed if expensive could do aspercreme or icy hot. Tylenol also an option. May end up needing surgery      UTI due to extended-spectrum beta lactamase (ESBL) producing Escherichia coli  :1}}  SUBJECTIVE: Background This is a 58 y.o. male who has HTN (hypertension), benign; Subcutaneous mass; GERD (gastroesophageal reflux disease); Headache; Obesity (BMI 30.0-34.9); UTI due to extended-spectrum beta lactamase (ESBL) producing Escherichia coli; Prostatitis; Does not  have health insurance; Hematuria; and Material hardship due to limited financial resources on their problem list.  ***   Reviewed chart records that patient  has a past medical history of Hypertension and Lipoma of back (08/2014).  Discussed ***    Problem list overviews that were updated at today's visit: Problem  Does Not Have Health Insurance  Hematuria  Material Hardship Due to Limited Financial Resources    Today's Verbally Confirmed *** Current Outpatient Medications on File Prior to Visit  Medication Sig   amLODipine (NORVASC) 5 MG tablet Take 1 tablet by mouth once daily   lisinopril-hydrochlorothiazide (ZESTORETIC) 20-25 MG tablet Take 1 tablet by mouth once daily   acetaminophen (TYLENOL) 325 MG tablet Take 650 mg by mouth every 6 (six) hours as needed for moderate pain or headache. (Patient not taking: Reported on 08/26/2023)   No current facility-administered medications on file prior to visit.  There are no discontinued medications.    Objective   Physical Exam     08/26/2023   11:03 AM 03/18/2023    2:59 PM 03/18/2023    1:39 PM  Vitals with BMI  Height 5\' 4"     Weight 183 lbs 13 oz  185 lbs  BMI 31.53  31.74  Systolic 136 107 629  Diastolic 88 76 88  Pulse 82 69 68   Wt Readings from Last 10 Encounters:  08/26/23 183 lb 12.8 oz (83.4 kg)  03/18/23 185 lb (83.9 kg)  03/07/23 182 lb 5.1 oz (82.7 kg)  02/28/23 183 lb 12.8 oz (83.4 kg)  05/17/22 186 lb 3.2 oz (84.5 kg)  05/25/21 202 lb 13.2 oz (92 kg)  04/16/21 179 lb 9.6 oz (81.5 kg)  03/06/21 180 lb (81.6 kg)  10/05/20 180 lb 6.4 oz (81.8 kg)  09/29/19 179 lb 6.1 oz (81.4 kg)   Vital signs reviewed.  Nursing notes reviewed. Weight trend reviewed. Abnormalities and Problem-Specific physical exam findings:  ***  General Appearance:  No acute distress appreciable.   Well-groomed, healthy-appearing male.  Well proportioned with no abnormal fat distribution.  Good muscle tone. Pulmonary:  Normal work of  breathing at rest, no respiratory distress apparent. SpO2: 96 %  Musculoskeletal: All extremities are intact.  Neurological:  Awake, alert, oriented, and engaged.  No obvious focal neurological deficits or cognitive impairments.  Sensorium seems unclouded.   Speech is clear and coherent with logical content. Psychiatric:  Appropriate mood, pleasant and cooperative demeanor, thoughtful and engaged during the exam  {Insert previous labs (optional):23779} {See past labs  Heme  Chem  Endocrine  Serology  Results Review (optional):1} No results found for any visits on 08/26/23. Admission on 03/03/2023, Discharged on 03/07/2023  Component Date Value   SARS Coronavirus 2 by RT* 03/03/2023 NEGATIVE    Influenza A by PCR 03/03/2023 NEGATIVE    Influenza B by PCR 03/03/2023 NEGATIVE    Resp Syncytial Virus by * 03/03/2023 NEGATIVE  Lipase 03/03/2023 20    Sodium 03/03/2023 130 (L)    Potassium 03/03/2023 3.0 (L)    Chloride 03/03/2023 94 (L)    CO2 03/03/2023 25    Glucose, Bld 03/03/2023 148 (H)    BUN 03/03/2023 13    Creatinine, Ser 03/03/2023 1.01    Calcium 03/03/2023 9.3    Total Protein 03/03/2023 7.9    Albumin 03/03/2023 4.2    AST 03/03/2023 41    ALT 03/03/2023 47 (H)    Alkaline Phosphatase 03/03/2023 46    Total Bilirubin 03/03/2023 0.6    GFR, Estimated 03/03/2023 >60    Anion gap 03/03/2023 11    WBC 03/03/2023 4.3    RBC 03/03/2023 5.86 (H)    Hemoglobin 03/03/2023 18.2 (H)    HCT 03/03/2023 49.5    MCV 03/03/2023 84.5    MCH 03/03/2023 31.1    MCHC 03/03/2023 36.8 (H)    RDW 03/03/2023 12.2    Platelets 03/03/2023 168    nRBC 03/03/2023 0.0    Color, Urine 03/03/2023 YELLOW    APPearance 03/03/2023 CLEAR    Specific Gravity, Urine 03/03/2023 1.023    pH 03/03/2023 6.0    Glucose, UA 03/03/2023 NEGATIVE    Hgb urine dipstick 03/03/2023 MODERATE (A)    Bilirubin Urine 03/03/2023 NEGATIVE    Ketones, ur 03/03/2023 NEGATIVE    Protein, ur 03/03/2023 TRACE  (A)    Nitrite 03/03/2023 NEGATIVE    Leukocytes,Ua 03/03/2023 NEGATIVE    RBC / HPF 03/03/2023 0-5    WBC, UA 03/03/2023 11-20    Bacteria, UA 03/03/2023 NONE SEEN    Squamous Epithelial / HPF 03/03/2023 0-5    Mucus 03/03/2023 PRESENT    Specimen Description 03/03/2023                     Value:BLOOD RIGHT ANTECUBITAL Performed at [facility], [address] [place], [place] [address]    Special Requests 03/03/2023                     Value:BOTTLES DRAWN AEROBIC AND ANAEROBIC Blood Culture adequate volume Performed at [facility], [address] [place], [place] [address]    Culture 03/03/2023                     Value:NO GROWTH 5 DAYS Performed at [facility], [address] [place], [place] [address]    Report Status 03/03/2023 03/08/2023 FINAL    Specimen Description 03/03/2023                     Value:BLOOD LEFT HAND Performed at [facility], [address], [place], [place] [address]    Special Requests 03/03/2023                     Value:BOTTLES DRAWN AEROBIC AND ANAEROBIC Blood Culture adequate volume Performed at [facility], [address] [place], [place] [address]    Culture 03/03/2023                     Value:NO GROWTH 5 DAYS Performed at [facility], [address], [place], [place] [address]    Report Status 03/03/2023 03/08/2023 FINAL    Lactic Acid, Venous 03/03/2023 1.2    WBC 03/04/2023 4.4    RBC 03/04/2023 5.22    Hemoglobin 03/04/2023 15.8    HCT 03/04/2023 44.0  MCV 03/04/2023 84.3    MCH 03/04/2023 30.3    MCHC 03/04/2023 35.9    RDW 03/04/2023 12.2    Platelets 03/04/2023 149 (L)    nRBC 03/04/2023 0.0    Neutrophils Relative % 03/04/2023 51    Neutro Abs 03/04/2023 2.3    Lymphocytes Relative 03/04/2023 28    Lymphs Abs 03/04/2023 1.2    Monocytes Relative 03/04/2023 17    Monocytes Absolute 03/04/2023 0.7    Eosinophils Relative 03/04/2023 2     Eosinophils Absolute 03/04/2023 0.1    Basophils Relative 03/04/2023 1    Basophils Absolute 03/04/2023 0.0    Immature Granulocytes 03/04/2023 1    Abs Immature Granulocytes 03/04/2023 0.02    Sodium 03/04/2023 131 (L)    Potassium 03/04/2023 2.8 (L)    Chloride 03/04/2023 96 (L)    CO2 03/04/2023 26    Glucose, Bld 03/04/2023 152 (H)    BUN 03/04/2023 11    Creatinine, Ser 03/04/2023 0.82    Calcium 03/04/2023 8.2 (L)    Total Protein 03/04/2023 6.4 (L)    Albumin 03/04/2023 2.8 (L)    AST 03/04/2023 268 (H)    ALT 03/04/2023 228 (H)    Alkaline Phosphatase 03/04/2023 86    Total Bilirubin 03/04/2023 0.8    GFR, Estimated 03/04/2023 >60    Anion gap 03/04/2023 9    Magnesium 03/04/2023 2.2    Hepatitis B Surface Ag 03/04/2023 NON REACTIVE    HCV Ab 03/04/2023 NON REACTIVE    Hep A IgM 03/04/2023 NON REACTIVE    Hep B C IgM 03/04/2023 NON REACTIVE    HIV Screen 4th Generatio* 03/04/2023 Non Reactive    Hgb A1c MFr Bld 03/04/2023 6.0 (H)    Mean Plasma Glucose 03/04/2023 126    WBC 03/05/2023 4.9    RBC 03/05/2023 4.83    Hemoglobin 03/05/2023 15.0    HCT 03/05/2023 42.4    MCV 03/05/2023 87.8    MCH 03/05/2023 31.1    MCHC 03/05/2023 35.4    RDW 03/05/2023 12.5    Platelets 03/05/2023 164    nRBC 03/05/2023 0.0    Neutrophils Relative % 03/05/2023 46    Neutro Abs 03/05/2023 2.3    Lymphocytes Relative 03/05/2023 34    Lymphs Abs 03/05/2023 1.6    Monocytes Relative 03/05/2023 17    Monocytes Absolute 03/05/2023 0.8    Eosinophils Relative 03/05/2023 2    Eosinophils Absolute 03/05/2023 0.1    Basophils Relative 03/05/2023 1    Basophils Absolute 03/05/2023 0.0    Immature Granulocytes 03/05/2023 0    Abs Immature Granulocytes 03/05/2023 0.02    Sodium 03/05/2023 134 (L)    Potassium 03/05/2023 3.6    Chloride 03/05/2023 102    CO2 03/05/2023 23    Glucose, Bld 03/05/2023 141 (H)    BUN 03/05/2023 13    Creatinine, Ser 03/05/2023 0.83    Calcium 03/05/2023  8.2 (L)    Total Protein 03/05/2023 6.0 (L)    Albumin 03/05/2023 2.7 (L)    AST 03/05/2023 77 (H)    ALT 03/05/2023 154 (H)    Alkaline Phosphatase 03/05/2023 65    Total Bilirubin 03/05/2023 0.3    GFR, Estimated 03/05/2023 >60    Anion gap 03/05/2023 9    Magnesium 03/05/2023 1.9    WBC 03/06/2023 4.7    RBC 03/06/2023 4.84    Hemoglobin 03/06/2023 14.5    HCT 03/06/2023 42.2    MCV 03/06/2023 87.2  MCH 03/06/2023 30.0    MCHC 03/06/2023 34.4    RDW 03/06/2023 12.3    Platelets 03/06/2023 170    nRBC 03/06/2023 0.0    Sodium 03/06/2023 137    Potassium 03/06/2023 3.6    Chloride 03/06/2023 106    CO2 03/06/2023 22    Glucose, Bld 03/06/2023 113 (H)    BUN 03/06/2023 12    Creatinine, Ser 03/06/2023 0.73    Calcium 03/06/2023 8.2 (L)    Total Protein 03/06/2023 5.9 (L)    Albumin 03/06/2023 2.6 (L)    AST 03/06/2023 43 (H)    ALT 03/06/2023 114 (H)    Alkaline Phosphatase 03/06/2023 62    Total Bilirubin 03/06/2023 0.3    GFR, Estimated 03/06/2023 >60    Anion gap 03/06/2023 9    WBC 03/06/2023 4.2    RBC 03/06/2023 4.71    Hemoglobin 03/06/2023 14.1    HCT 03/06/2023 40.7    MCV 03/06/2023 86.4    MCH 03/06/2023 29.9    MCHC 03/06/2023 34.6    RDW 03/06/2023 12.0    Platelets 03/06/2023 197    nRBC 03/06/2023 0.0    Sodium 03/06/2023 136    Potassium 03/06/2023 3.8    Chloride 03/06/2023 105    CO2 03/06/2023 24    Glucose, Bld 03/06/2023 117 (H)    BUN 03/06/2023 14    Creatinine, Ser 03/06/2023 0.73    Calcium 03/06/2023 8.4 (L)    Total Protein 03/06/2023 5.7 (L)    Albumin 03/06/2023 2.6 (L)    AST 03/06/2023 58 (H)    ALT 03/06/2023 115 (H)    Alkaline Phosphatase 03/06/2023 57    Total Bilirubin 03/06/2023 <0.1 (L)    GFR, Estimated 03/06/2023 >60    Anion gap 03/06/2023 7   Office Visit on 02/28/2023  Component Date Value   SARS Coronavirus 2 Ag 02/28/2023 Negative    Color, UA 02/28/2023 cloudy    Clarity, UA 02/28/2023 amber    Glucose,  UA 02/28/2023 Negative    Bilirubin, UA 02/28/2023 negative    Ketones, UA 02/28/2023 negative    Spec Grav, UA 02/28/2023 1.025    Blood, UA 02/28/2023 positive (A)    pH, UA 02/28/2023 6.0    Protein, UA 02/28/2023 Positive (A)    Urobilinogen, UA 02/28/2023 1.0    Nitrite, UA 02/28/2023 negative    Leukocytes, UA 02/28/2023 Negative    MICRO NUMBER: 02/28/2023 [id number]    SPECIMEN QUALITY: 02/28/2023 Adequate    Sample Source 02/28/2023 URINE    STATUS: 02/28/2023 FINAL    ISOLATE 1: 02/28/2023 ESBL Escherichia coli (A)    Neisseria Gonorrhea 02/28/2023 Negative    Chlamydia 02/28/2023 Negative    Comment 02/28/2023 Normal Reference Ranger Chlamydia - Negative    Comment 02/28/2023 Normal Reference Range Neisseria Gonorrhea - Negative   No image results found. No results found.US Abdomen Limited RUQ (LIVER/GB) Result Date: 03/04/2023 CLINICAL DATA:  Liver injury EXAM: ULTRASOUND ABDOMEN LIMITED RIGHT UPPER QUADRANT COMPARISON:  CT 03/03/2023 FINDINGS: Gallbladder: Surgically absent Common bile duct: Diameter: 3 mm Liver: Echogenic hepatic parenchyma consistent with fatty liver infiltration. With this level of echogenicity evaluation for underlying mass lesion is limited and if needed contrast CT or MRI as clinically directed. Portal vein is patent on color Doppler imaging with normal direction of blood flow towards the liver. Other: None. IMPRESSION: Previous cholecystectomy. No ductal dilatation. Fatty liver infiltration. If there is further concern of liver abnormality a contrast study may be useful  for further delineation when clinically appropriate Electronically Signed   By: Karen Kays M.D.   On: 03/04/2023 13:00   CT Renal Stone Study Result Date: 03/03/2023 CLINICAL DATA:  Fever. EXAM: CT ABDOMEN AND PELVIS WITHOUT CONTRAST TECHNIQUE: Multidetector CT imaging of the abdomen and pelvis was performed following the standard protocol without IV contrast. RADIATION DOSE REDUCTION: This  exam was performed according to the departmental dose-optimization program which includes automated exposure control, adjustment of the mA and/or kV according to patient size and/or use of iterative reconstruction technique. COMPARISON:  None Available. FINDINGS: Lower chest: Mild linear atelectasis is seen within the right lung base. Hepatobiliary: No focal liver abnormality is seen. Status post cholecystectomy. No biliary dilatation. Pancreas: Unremarkable. No pancreatic ductal dilatation or surrounding inflammatory changes. Spleen: Normal in size without focal abnormality. Adrenals/Urinary Tract: Adrenal glands are unremarkable. Kidneys are normal in size, without renal calculi, focal lesion, or hydronephrosis. There is mild bilateral perinephric inflammatory fat stranding, right greater than left. Bladder is unremarkable. Stomach/Bowel: There is a small hiatal hernia. Appendix appears normal. No evidence of bowel wall thickening, distention, or inflammatory changes. Vascular/Lymphatic: No significant vascular findings are present. No enlarged abdominal or pelvic lymph nodes. Reproductive: There is mild to moderate severity prostate gland enlargement. A mild amount of surrounding inflammatory fat stranding is seen. Other: No abdominal wall hernia or abnormality. No abdominopelvic ascites. Musculoskeletal: No acute or significant osseous findings. IMPRESSION: 1. Mild acute prostatitis. 2. Findings which may represent sequelae associated with acute pyelonephritis. Correlation with urinalysis is recommended. 3. Small hiatal hernia. 4. Evidence of prior cholecystectomy. Electronically Signed   By: Aram Candela M.D.   On: 03/03/2023 20:38   DG Chest 2 View Result Date: 03/03/2023 CLINICAL DATA:  Chills and occasional cough. EXAM: CHEST - 2 VIEW COMPARISON:  June 04, 2004 FINDINGS: The heart size and mediastinal contours are within normal limits. Low lung volumes are noted with mild elevation of the right  hemidiaphragm. Very mild right basilar atelectasis and/or infiltrate is seen. No pleural effusion or pneumothorax is identified. Radiopaque surgical clips are present within the right upper quadrant. The visualized skeletal structures are unremarkable. IMPRESSION: Low lung volumes with very mild right basilar atelectasis and/or infiltrate. Electronically Signed   By: Aram Candela M.D.   On: 03/03/2023 15:28       Assessment & Plan Material hardship due to limited financial resources  Acute prostatitis  Hematuria, unspecified type  Does not have health insurance       Orders Placed During this Encounter:   Orders Placed This Encounter  Procedures   Urinalysis w microscopic + reflex cultur   CBC with Differential/Platelet   Comprehensive metabolic panel    Standing Status:   Future    Expiration Date:   08/25/2024   Ambulatory referral to Urology    Referral Priority:   Routine    Referral Type:   Consultation    Referral Reason:   Specialty Services Required    Requested Specialty:   Urology    Number of Visits Requested:   1   AMB Referral VBCI Care Management    Referral Priority:   Routine    Referral Type:   Consultation    Referral Reason:   Care Coordination    Number of Visits Requested:   1   Meds ordered this encounter  Medications   tamsulosin (FLOMAX) 0.4 MG CAPS capsule    Sig: Take 1 capsule (0.4 mg total) by mouth daily.  Dispense:  30 capsule    Refill:  3   amoxicillin-clavulanate (AUGMENTIN) 875-125 MG tablet    Sig: Take 1 tablet by mouth 2 (two) times daily.    Dispense:  20 tablet    Refill:  0   phenazopyridine (PYRIDIUM) 95 MG tablet    Sig: Take 1 tablet (95 mg total) by mouth 3 (three) times daily as needed for pain.    Dispense:  10 tablet    Refill:  0   ***    This document was synthesized by artificial intelligence ([employer]bridge) using HIPAA-compliant recording of the clinical interaction;   We discussed the use of AI scribe software  for clinical note transcription with the patient, who gave verbal consent to proceed.    Additional Info: This encounter employed state-of-the-art, real-time, collaborative documentation. The patient actively reviewed and assisted in updating their electronic medical record on a shared screen, ensuring transparency and facilitating joint problem-solving for the problem list, overview, and plan. This approach promotes accurate, informed care. The treatment plan was discussed and reviewed in detail, including medication safety, potential side effects, and all patient questions. We confirmed understanding and comfort with the plan. Follow-up instructions were established, including contacting the office for any concerns, returning if symptoms worsen, persist, or new symptoms develop, and precautions for potential emergency department visits.

## 2023-08-27 NOTE — Progress Notes (Signed)
Complex Care Management Note  Care Guide Note 08/27/2023 Name: Douglas Ford MRN: [id number] DOB: 1966/06/11  Douglas Ford is a 58 y.o. year old male who sees Hunter, Aldine Contes, MD for primary care. I reached out to [name] by phone today to offer complex care management services.  [name] was given information about Complex Care Management services today including:   The Complex Care Management services include support from the care team which includes your Nurse Coordinator, Clinical Social Worker, or Pharmacist.  The Complex Care Management team is here to help remove barriers to the health concerns and goals most important to you. Complex Care Management services are voluntary, and the patient may decline or stop services at any time by request to their care team member.   Complex Care Management Consent Status: Patient agreed to services and verbal consent obtained.   Follow up plan:  Telephone appointment with complex care management team member scheduled for:  08/29/2023 and 09/01/2023  Encounter Outcome:  Patient Scheduled  Burman Nieves, CMA, Care Guide [facility]  [facility], [facility] Guide Direct Dial: [phone]  Fax: [phone] Website: [email or web address]

## 2023-08-27 NOTE — Assessment & Plan Note (Signed)
Recurrent Prostatitis with ESBL E. Coli Recurrent prostatitis with ESBL E. Coli infection was initially diagnosed in July, requiring hospitalization and IV antibiotics. The current episode began yesterday, presenting with hematuria, pain, and dysuria. Blood clots in the urine likely cause intermittent urinary obstruction. Persistent ESBL E. Coli infection in the prostate necessitates prolonged antibiotic treatment. There is a risk of bladder rupture if urinary retention occurs, requiring immediate emergency care. ESBL E. Coli often requires extended IV antibiotics and possible hospital admission. Alternative treatments, including bladder flush and Foley catheter placement, were discussed. Due to financial constraints, there is a preference to avoid hospital stay and any medical expenses.  We opted for a low cost solution, based on esbl cultures: Prescribe Augmentin twice daily for six weeks, Flomax to improve urine flow, and Pyridium for dysuria. Encourage increased fluid intake to prevent blood clot formation.  Remaining is likely not completed due to cost concerns but I did order a urine culture to confirm the presence of ESBL E. Coli and refer to a urologist for further management and potential Foley catheter placement if urinary retention worsens. Ordered blood work to check kidney function and blood loss. Instructed to go to the emergency room if unable to urinate for potential Foley catheter placement and bladder flush. Contact a Child psychotherapist for assistance with medical costs and discuss potential financial assistance options, including Medicaid and 211 services.

## 2023-08-27 NOTE — Assessment & Plan Note (Signed)
Discuss financial constraints and potential assistance programs. Contact a Child psychotherapist for assistance with medical costs and discuss potential financial assistance options, including Medicaid and 211 services.  Follow-up Refer to a urologist for further management. Order blood work to check kidney function and blood loss, and a urine culture to confirm the presence of ESBL E. Coli. Contact a Child psychotherapist for assistance with medical costs and discuss potential financial assistance options, including Medicaid and 211 services.

## 2023-08-29 ENCOUNTER — Ambulatory Visit: Payer: Self-pay | Admitting: Licensed Clinical Social Worker

## 2023-08-29 ENCOUNTER — Encounter: Payer: Self-pay | Admitting: Internal Medicine

## 2023-08-29 LAB — CULTURE INDICATED

## 2023-08-29 LAB — URINALYSIS W MICROSCOPIC + REFLEX CULTURE
Bilirubin Urine: NEGATIVE
Ketones, ur: NEGATIVE
Nitrites, Initial: POSITIVE — AB
Specific Gravity, Urine: 1.019 (ref 1.001–1.035)
WBC, UA: >=60 /[HPF] — AB (ref 0–5)
pH: 6 (ref 5.0–8.0)

## 2023-08-29 LAB — URINE CULTURE
MICRO NUMBER:: 15982537
SPECIMEN QUALITY:: ADEQUATE

## 2023-08-29 MED ORDER — NITROFURANTOIN MONOHYD MACRO 100 MG PO CAPS
100.0000 mg | ORAL_CAPSULE | Freq: Two times a day (BID) | ORAL | 0 refills | Status: AC
Start: 2023-08-29 — End: ?

## 2023-08-29 NOTE — Patient Instructions (Signed)
Visit Information  Thank you for taking time to visit with me today. Please don't hesitate to contact me if I can be of assistance to you.   Following are the goals we discussed today:   Goals Addressed             This Visit's Progress    Care Coordination Activities       Care Coordination Interventions: Patient stated that he lost his job a couple of days ago for no reason and is in need of another job and that he has a husband that is very supportive. Patient stated that he is going to apply for food stamps and medicaid but really does not to use it if he does not have to he hopes to have another job soon. SW provided the DSS location address because the patient stated that he wanted to go in person. Sw will also mail the patient food pantry resources and utility resources.  Patient stated that he was also going to apply for unemployment  Sw went through the SDOH and no current needs but will still mail resources. Patient stated that he has some medical bills but is not concerned about those at the moment. SW and patient will review on the follow up call on 09/15/2023 at 11:30 am        Our next appointment is by telephone on 09/15/2023 at 11:30 am  Please call the care guide team at [phone] if you need to cancel or reschedule your appointment.   If you are experiencing a Mental Health or Behavioral Health Crisis or need someone to talk to, please call the Suicide and Crisis Lifeline: 988 go to [facility] [address] [place] [phone]) call 911  Patient verbalizes understanding of instructions and care plan provided today and agrees to view in MyChart. Active MyChart status and patient understanding of how to access instructions and care plan via MyChart confirmed with patient.     Jeanie Cooks, PhD [facility], [place], [facility] Social Worker Direct Dial: [phone]  Fax: [phone]

## 2023-08-29 NOTE — Progress Notes (Signed)
The result of the urine testing was consistent with recurrent ESBL prostatitis, as I predicted at our visit.  While Augmentin (amoxicillin/clavulanate) can be effective against some ESBL (Extended-Spectrum Beta-Lactamase) producing organisms, completely eradicating recurrent ESBL prostatitis after a carbapenem treatment is challenging. Here are some important considerations:  1. Antibiotic Sensitivity: The fact that the ESBL culture is sensitive to Augmentin is promising, but sensitivity doesn't always guarantee clinical cure, especially in recurrent prostatitis.  2. Penetration Challenges: Prostate tissue has poor antibiotic penetration, which can make completely eradicating the infection difficult. Augmentin's penetration into prostatic tissue is limited compared to some other antibiotics.  3. Treatment Considerations: - A prolonged course of Augmentin might be necessary - Combination therapy could be considered - Urological investigation to rule out underlying structural issues is crucial  4. Potential Limitations: - Recurrent ESBL infections often indicate complex underlying conditions - Resistance can develop quickly - The infection might represent a persistent focus that antibiotics alone cannot resolve  Recommendation: Consult with an infectious disease specialist and urologist to: - Confirm optimal antibiotic choice - Consider prostatic imaging - Evaluate for potential surgical intervention or drainage if needed - Potentially use combination antibiotic strategies - Consider extended antibiotic duration  A comprehensive approach addressing both the infection and potential underlying factors is essential for managing recurrent ESBL prostatitis, but patient reported severe financial constraints at last visit so he has yet to agree. Will urge reconsideration while adding nitrofurantoin (the only other by mouth antibiotic(s) with sensitivity although not ideal)

## 2023-08-29 NOTE — Patient Outreach (Signed)
  Care Coordination   Initial Visit Note   08/29/2023 Name: Douglas Ford MRN: [id number] DOB: October 13, 1965  Douglas Ford is a 58 y.o. year old male who sees Douglas Ford, Douglas Contes, MD for primary care. I spoke with  Douglas Ford by phone today.  What matters to the patients health and wellness today?  Financial insecurities     Goals Addressed             This Visit's Progress    Care Coordination Activities       Care Coordination Interventions: Patient stated that he lost his job a couple of days ago for no reason and is in need of another job and that he has a husband that is very supportive. Patient stated that he is going to apply for food stamps and medicaid but really does not to use it if he does not have to he hopes to have another job soon. SW provided the DSS location address because the patient stated that he wanted to go in person. Sw will also mail the patient food pantry resources and utility resources.  Patient stated that he was also going to apply for unemployment  Sw went through the SDOH and no current needs but will still mail resources. Patient stated that he has some medical bills but is not concerned about those at the moment. SW and patient will review on the follow up call on 09/15/2023 at 11:30 am        SDOH assessments and interventions completed:  Yes  SDOH Interventions Today    Flowsheet Row Most Recent Value  SDOH Interventions   Food Insecurity Interventions Other (Comment)  [Patient will apply for food stamps and medicaid and SW will mail out a food pantry list]  Housing Interventions Intervention Not Indicated  Transportation Interventions Intervention Not Indicated  Utilities Interventions Intervention Not Indicated  Financial Strain Interventions Community Resources Provided, Other (Comment)  [Patient will apply for Unemployment]        Care Coordination Interventions:  Yes, provided  Interventions Today    Flowsheet Row Most Recent  Value  General Interventions   General Interventions Discussed/Reviewed General Interventions Discussed, Communication with  [Patient lost job and is going to apply for unemployment, food stamps and medicaid, and SW will mail out resources for Food panty and Utility assistance]        Follow up plan: Follow up call scheduled for 09/15/2023 at 11:30 am    Encounter Outcome:  Patient Visit Completed Douglas Cooks, PhD [facility], [facility] Social Worker Direct Dial: [phone]  Fax: [phone]

## 2023-08-29 NOTE — Addendum Note (Signed)
Addended by: Lula Olszewski on: 08/29/2023 08:20 AM   Modules accepted: Orders

## 2023-09-01 ENCOUNTER — Ambulatory Visit: Payer: Self-pay

## 2023-09-01 NOTE — Patient Instructions (Signed)
Visit Information  Thank you for taking time to visit with me today. Please don't hesitate to contact me if I can be of assistance to you.   Following are the goals we discussed today:   Goals Addressed             This Visit's Progress    Managing prostatits       Care Coordination Interventions: Evaluation of current treatment plan related to prostatitis and patient's adherence to plan as established by provider Completing urology referral  Patient reports that he is doing better and able to urinate with no problems.  Discussed applying for medicaid as suggested by social work.  Advised this will help him cover his medication expenses related to his prostatitis and over all care.  He verbalized understanding.  Discussed signs of infection and when to notify physician.  He verbalized understanding.  No concerns.           Our next appointment is by telephone on 09/29/23 at 100 pm  Please call the care guide team at [phone] if you need to cancel or reschedule your appointment.   If you are experiencing a Mental Health or Behavioral Health Crisis or need someone to talk to, please call the Suicide and Crisis Lifeline: 988   Patient verbalizes understanding of instructions and care plan provided today and agrees to view in MyChart. Active MyChart status and patient understanding of how to access instructions and care plan via MyChart confirmed with patient.     The patient has been provided with contact information for the care management team and has been advised to call with any health related questions or concerns.   Bary Leriche RN, MSN [facility], [facility] Health RN Care Manager Direct Dial: [phone]  Fax: [phone] Website: [email or web address]

## 2023-09-01 NOTE — Patient Outreach (Signed)
  Care Coordination   Follow Up Visit Note   09/01/2023 Name: Douglas Ford MRN: [id number] DOB: 01-Dec-1965  Douglas Ford is a 58 y.o. year old male who sees Hunter, Aldine Contes, MD for primary care. I spoke with  [name] by phone today.  What matters to the patients health and wellness today?  Managing prostatitis flares    Goals Addressed             This Visit's Progress    Managing prostatits       Care Coordination Interventions: Evaluation of current treatment plan related to prostatitis and patient's adherence to plan as established by provider Completing urology referral  Patient reports that he is doing better and able to urinate with no problems.  Discussed applying for medicaid as suggested by social work.  Advised this will help him cover his medication expenses related to his prostatitis and over all care.  He verbalized understanding.  Discussed signs of infection and when to notify physician.  He verbalized understanding.  No concerns.           SDOH assessments and interventions completed:  Yes     Care Coordination Interventions:  Yes, provided   Follow up plan: Follow up call scheduled for February    Encounter Outcome:  Patient Visit Completed   Bary Leriche RN, MSN [facility], [facility] Health RN Care Manager Direct Dial: [phone]  Fax: [phone] Website: [email or web address]

## 2023-09-08 NOTE — Progress Notes (Signed)
Chief Complaint: No chief complaint on file.   History of Present Illness:  Douglas Ford is a 58 y.o. male who is seen in consultation from Shelva Majestic, MD for evaluation of recurrent prostatitis.  The patient denies prior urologic investigation/consultation.  He did have a UTI which was relatively uncomplicated 5 years ago.  Apparently treated with a short course of antibiotics.  He became ill in July 2024.  This necessitated hospitalization for management of urinary tract infection, considered prostatitis.  He grew ESBL E. coli.  Treated with IV antibiotics at home after hospitalization.  Approximately 1 month ago also developed lower urinary tract symptoms including hematuria.  He again grew ESBL E. coli.  Placed on Augmentin as well as nitrofurantoin.  Currently, on nitrofurantoin only, for about a month and a half.  Symptoms are better.  No dysuria, no gross hematuria, no fever, no urinary frequency.  The patient is homosexual.  He does participate in anal intercourse, usually without a condom.  He is monogamous with his husband, [name].   Past Medical History:  Past Medical History:  Diagnosis Date   Hypertension    Lipoma of back 08/2014   ultrasound-confirmed    Past Surgical History:  Past Surgical History:  Procedure Laterality Date   LAPAROSCOPIC CHOLECYSTECTOMY  2005   TONSILECTOMY, ADENOIDECTOMY, BILATERAL MYRINGOTOMY AND TUBES      Allergies:  No Known Allergies  Family History:  Family History  Problem Relation Age of Onset   Breast cancer Mother        mets   Other Father        poor communication   Breast cancer Sister    Non-Hodgkin's lymphoma Brother    Colon cancer Neg Hx     Social History:  Social History   Tobacco Use   Smoking status: Never   Smokeless tobacco: Never  Substance Use Topics   Alcohol use: Yes    Comment: social   Drug use: No    Review of symptoms:  Constitutional:  Negative for unexplained weight loss, night  sweats, fever, chills ENT:  Negative for nose bleeds, sinus pain, painful swallowing CV:  Negative for chest pain, shortness of breath, exercise intolerance, palpitations, loss of consciousness Resp:  Negative for cough, wheezing, shortness of breath GI:  Negative for nausea, vomiting, diarrhea, bloody stools GU:  Positives noted in HPI; otherwise negative for gross hematuria, dysuria, urinary incontinence Neuro:  Negative for seizures, poor balance, limb weakness, slurred speech Psych:  Negative for lack of energy, depression, anxiety Endocrine:  Negative for polydipsia, polyuria, symptoms of hypoglycemia (dizziness, hunger, sweating) Hematologic:  Negative for anemia, purpura, petechia, prolonged or excessive bleeding, use of anticoagulants  Allergic:  Negative for difficulty breathing or choking as a result of exposure to anything; no shellfish allergy; no allergic response (rash/itch) to materials, foods  Physical exam: There were no vitals taken for this visit. GENERAL APPEARANCE:  Well appearing, well developed, well nourished, NAD HEENT: Atraumatic, Normocephalic. NECK: Normal appearance LUNGS: Normal inspiratory and expiratory excursion HEART: Regular Rate ABDOMEN: No inguinal hernias GU: Phallus normal, no lesions. Scrotal skin normal. Testicles/epididymal structures normal. Meatus normal. Normal anal sphincter tone, prostate 30 mL, symmetric, non nodular, non tender. EXTREMITIES: Moves all extremities well.  Without clubbing, cyanosis, or edema. NEUROLOGIC:  Alert and oriented x 3, normal gait, CN II-XII grossly intact.  MENTAL STATUS:  Appropriate. SKIN:  Warm, dry and intact.    Results: No results found for this or any  previous visit (from the past 24 hours).  I have reviewed referring/prior physicians notes  I have reviewed urinalysis--clear  I have reviewed PSA results--1.25 in September, 2022  I have reviewed prior imaging--CT urogram 7.29.2024-- IMPRESSION: 1.  Mild acute prostatitis. 2. Findings which may represent sequelae associated with acute pyelonephritis. Correlation with urinalysis is recommended. 3. Small hiatal hernia. 4. Evidence of prior cholecystectomy.  IPSS reviewed-7/2  Urine cultures reviewed    I have reviewed urine culture results  Assessment: 1.  Prostatitis, recurrent.  ESBL E. coli grew out both in July 2024 and last month.  Currently treated with an extended course of nitrofurantoin, initially with Augmentin as well.  Urine is clear  2.  Screening for prostate cancer, normal DRE, normal PSA 2-1/2 years ago   Plan: 1.  I reassured the patient and his husband about his exam as well as a clear urine  2.  I did recommend condom usage with anal intercourse in the future  3.  I will have him come back in about 4 months for repeat urinalysis, symptom check

## 2023-09-15 ENCOUNTER — Ambulatory Visit: Payer: Self-pay | Admitting: Licensed Clinical Social Worker

## 2023-09-15 NOTE — Patient Instructions (Signed)
 Visit Information  Thank you for taking time to visit with me today. Please don't hesitate to contact me if I can be of assistance to you.   Following are the goals we discussed today:   Goals Addressed             This Visit's Progress    Care Coordination Activities   On track    Care Coordination Interventions: Patient stated that he lost his job a couple of days ago for no reason and is in need of another job and that he has a husband that is very supportive. Patient stated that he is going to apply for food stamps and medicaid but really does not to use it if he does not have to he hopes to have another job soon. SW provided the DSS location address because the patient stated that he wanted to go in person. Sw will also mail the patient food pantry resources and utility resources.  Patient stated that he was also going to apply for unemployment  Sw went through the SDOH and no current needs but will still mail resources. Patient asked SW to re mail the food pantry list Patient stated that he has some medical bills but is not concerned about those at the moment. SW and patient will review on the follow up call on 10/01/2023 at 10:00 am        Our next appointment is by telephone on 10/01/2023 at 10:00 am  Please call the care guide team at [phone] if you need to cancel or reschedule your appointment.   If you are experiencing a Mental Health or Behavioral Health Crisis or need someone to talk to, please call the Suicide and Crisis Lifeline: 988 go to [facility] [address] [place] [phone]) call 911  Patient verbalizes understanding of instructions and care plan provided today and agrees to view in MyChart. Active MyChart status and patient understanding of how to access instructions and care plan via MyChart confirmed with patient.     [name], PhD [facility], [facility] Social Worker Direct Dial: [phone]  Fax: [phone]

## 2023-09-15 NOTE — Patient Outreach (Signed)
  Care Coordination   Initial Visit Note   09/15/2023 Name: Douglas Ford MRN: [id number] DOB: [birth year]  Douglas Ford is a 58 y.o. year old male who sees Hunter, Saverio Curling, MD for primary care. I spoke with  [name] by phone today.  What matters to the patients health and wellness today?  Food Insecurities     Goals Addressed             This Visit's Progress    Care Coordination Activities   On track    Care Coordination Interventions: Patient stated that he lost his job a couple of days ago for no reason and is in need of another job and that he has a husband that is very supportive. Patient stated that he is going to apply for food stamps and medicaid but really does not to use it if he does not have to he hopes to have another job soon. SW provided the DSS location address because the patient stated that he wanted to go in person. Sw will also mail the patient food pantry resources and utility resources.  Patient stated that he was also going to apply for unemployment  Sw went through the SDOH and no current needs but will still mail resources. Patient asked SW to re mail the food pantry list Patient stated that he has some medical bills but is not concerned about those at the moment. SW and patient will review on the follow up call on 10/01/2023 at 10:00 am        SDOH assessments and interventions completed:  Yes  SDOH Interventions Today    Flowsheet Row Most Recent Value  SDOH Interventions   Food Insecurity Interventions Other (Comment)  [Patient asked SW to remail the pantry list again]  Housing Interventions Intervention Not Indicated  Transportation Interventions Intervention Not Indicated  Utilities Interventions Intervention Not Indicated        Care Coordination Interventions:  Yes, provided  Interventions Today    Flowsheet Row Most Recent Value  General Interventions   General Interventions Discussed/Reviewed General Interventions Reviewed,  Science writer asked SW to [place] the food pantry list]        Follow up plan: Follow up call scheduled for 10/01/2023 at 10:00 am    Encounter Outcome:  Patient Visit Completed   [name], PhD [facility], [facility], [facility] Social Worker Direct Dial: [phone]  Fax: [phone]

## 2023-09-22 ENCOUNTER — Ambulatory Visit: Payer: Self-pay | Admitting: Urology

## 2023-09-22 VITALS — BP 133/87 | HR 76 | Ht 64.0 in | Wt 185.0 lb

## 2023-09-22 DIAGNOSIS — Z125 Encounter for screening for malignant neoplasm of prostate: Secondary | ICD-10-CM

## 2023-09-22 DIAGNOSIS — Z85528 Personal history of other malignant neoplasm of kidney: Secondary | ICD-10-CM

## 2023-09-22 DIAGNOSIS — N410 Acute prostatitis: Secondary | ICD-10-CM

## 2023-09-22 DIAGNOSIS — Z87438 Personal history of other diseases of male genital organs: Secondary | ICD-10-CM

## 2023-09-22 DIAGNOSIS — Z09 Encounter for follow-up examination after completed treatment for conditions other than malignant neoplasm: Secondary | ICD-10-CM

## 2023-09-22 LAB — URINALYSIS, ROUTINE W REFLEX MICROSCOPIC
Bilirubin, UA: NEGATIVE
Glucose, UA: NEGATIVE
Ketones, UA: NEGATIVE
Leukocytes,UA: NEGATIVE
Nitrite, UA: NEGATIVE
RBC, UA: NEGATIVE
Specific Gravity, UA: >1.03 — ABNORMAL HIGH (ref 1.005–1.030)
Urobilinogen, Ur: 0.2 mg/dL (ref 0.2–1.0)
pH, UA: 5.5 (ref 5.0–7.5)

## 2023-09-22 LAB — MICROSCOPIC EXAMINATION
Bacteria, UA: NONE SEEN
RBC, Urine: NONE SEEN /[HPF] (ref 0–2)

## 2023-09-29 ENCOUNTER — Ambulatory Visit: Payer: Self-pay

## 2023-09-29 NOTE — Patient Instructions (Signed)
 Visit Information  Thank you for taking time to visit with me today. Please don't hesitate to contact me if I can be of assistance to you.   Following are the goals we discussed today:   Goals Addressed             This Visit's Progress    COMPLETED: Managing prostatitis       Care Coordination Interventions: Evaluation of current treatment plan related to prostatitis and patient's adherence to plan as established by provider Completing urology referral  Spoke with patient. He reports he is doing okay.  He did see the urologist concerning prostatitis.  He states he was told infection was clear and follow up in 4-6 month if needed.  Advised on this being last call as goal of seeing urologist has been met and patient has no further concerns.  He verbalized understanding.          If you are experiencing a Mental Health or Behavioral Health Crisis or need someone to talk to, please call the Suicide and Crisis Lifeline: 988   Patient verbalizes understanding of instructions and care plan provided today and agrees to view in MyChart. Active MyChart status and patient understanding of how to access instructions and care plan via MyChart confirmed with patient.     The patient has been provided with contact information for the care management team and has been advised to call with any health related questions or concerns.   Bary Leriche RN, MSN [facility], [facility] Health RN Care Manager Direct Dial: [phone]  Fax: [phone] Website: [email or web address]

## 2023-09-29 NOTE — Patient Outreach (Signed)
 Care Coordination   Follow Up Visit Note   09/29/2023 Name: Douglas Ford MRN: [id number] DOB: [birth year]  Douglas Ford is a 58 y.o. year old male who sees Hunter, Aldine Contes, MD for primary care. I spoke with  [name] by phone today.  What matters to the patients health and wellness today?  Prostatitis clear    Goals Addressed             This Visit's Progress    COMPLETED: Managing prostatitis       Care Coordination Interventions: Evaluation of current treatment plan related to prostatitis and patient's adherence to plan as established by provider Completing urology referral  Spoke with patient. He reports he is doing okay.  He did see the urologist concerning prostatitis.  He states he was told infection was clear and follow up in 4-6 month if needed.  Advised on this being last call as goal of seeing urologist has been met and patient has no further concerns.  He verbalized understanding.          SDOH assessments and interventions completed:  Yes     Care Coordination Interventions:  Yes, provided   Follow up plan: No further intervention required.   Encounter Outcome:  Patient Visit Completed   Bary Leriche RN, MSN [facility], [facility] Health RN Care Manager Direct Dial: [phone]  Fax: [phone] Website: [email or web address]

## 2023-10-01 ENCOUNTER — Ambulatory Visit: Payer: Self-pay | Admitting: Licensed Clinical Social Worker

## 2023-10-01 NOTE — Patient Outreach (Signed)
 Care Coordination   10/01/2023 Name: Douglas Ford MRN: [id number] DOB: 12/22/1965   Care Coordination Outreach Attempts:  An unsuccessful outreach was attempted for an appointment today.  Follow Up Plan:  Additional outreach attempts will be made to offer the patient complex care management information and services.   Encounter Outcome:  No Answer   Care Coordination Interventions:  No, not indicated    SW has scheduled another follow up call on 10/20/2023 at 9:30 am  Jeanie Cooks, PhD [facility], [facility] Social Worker Direct Dial: [phone]  Fax: [phone]

## 2023-10-17 ENCOUNTER — Telehealth: Payer: Self-pay | Admitting: Urology

## 2023-10-17 NOTE — Telephone Encounter (Signed)
 called and lvm to call back to r/s apt with Dr. Retta Diones due to him being out of office 10/17/23 ANN

## 2023-10-20 ENCOUNTER — Ambulatory Visit: Payer: Self-pay | Admitting: Licensed Clinical Social Worker

## 2023-10-20 NOTE — Patient Outreach (Signed)
 Care Coordination   Follow Up Visit Note   10/20/2023 Name: Douglas Ford MRN: [id number] DOB: Nov 23, 1965  Douglas Ford is a 58 y.o. year old male who sees Hunter, Aldine Contes, MD for primary care. I spoke with  [name] by phone today.  What matters to the patients health and wellness today?  Resources mailed for food insecurities     Goals Addressed             This Visit's Progress    COMPLETED: Care Coordination Activities       Care Coordination Interventions: Patient stated that he lost his job a couple of days ago for no reason and is in need of another job and that he has a husband that is very supportive. Patient stated that he is going to apply for food stamps and medicaid but really does not to use it if he does not have to he hopes to have another job soon. SW provided the DSS location address because the patient stated that he wanted to go in person. Sw will also mail the patient food pantry resources and utility resources.  Patient stated that he was also going to apply for unemployment  Sw went through the SDOH and no current needs but will still mail resources. Patient asked SW to re mail the food pantry list Patient stated that he has some medical bills but is not concerned about those at the moment. SW and patient will review on the follow up call on 10/01/2023 at 10:00 am        SDOH assessments and interventions completed:  Yes  SDOH Interventions Today    Flowsheet Row Most Recent Value  SDOH Interventions   Food Insecurity Interventions Intervention Not Indicated  Housing Interventions Intervention Not Indicated  Transportation Interventions Intervention Not Indicated  Utilities Interventions Intervention Not Indicated        Care Coordination Interventions:  Yes, provided  Interventions Today    Flowsheet Row Most Recent Value  General Interventions   General Interventions Discussed/Reviewed General Interventions Reviewed, [name] stated that he will check his mail and if he has not received the resources he will call back, No follow up needed form SW.]        Follow up plan: No further intervention required.   Encounter Outcome:  Patient Visit Completed   Jeanie Cooks, PhD [facility], [facility] Social Worker Direct Dial: [phone]  Fax: [phone]

## 2023-10-20 NOTE — Patient Instructions (Signed)
 Visit Information  Thank you for taking time to visit with me today. Please don't hesitate to contact me if I can be of assistance to you.   Following are the goals we discussed today:   Goals Addressed             This Visit's Progress    COMPLETED: Care Coordination Activities       Care Coordination Interventions: Patient stated that he lost his job a couple of days ago for no reason and is in need of another job and that he has a husband that is very supportive. Patient stated that he is going to apply for food stamps and medicaid but really does not to use it if he does not have to he hopes to have another job soon. SW provided the DSS location address because the patient stated that he wanted to go in person. Sw will also mail the patient food pantry resources and utility resources.  Patient stated that he was also going to apply for unemployment  Sw went through the SDOH and no current needs but will still mail resources. Patient asked SW to re mail the food pantry list Patient stated that he has some medical bills but is not concerned about those at the moment. SW and patient will review on the follow up call on 10/01/2023 at 10:00 am        No follow up needed, SW encouraged the patient to contact the PCP for any future SDOH Needs   Please call the care guide team at [phone] if you need to cancel or reschedule your appointment.   If you are experiencing a Mental Health or Behavioral Health Crisis or need someone to talk to, please call the Suicide and Crisis Lifeline: 988 go to [facility] Urgent [facility] [address], [place] [phone]) call 911  Patient verbalizes understanding of instructions and care plan provided today and agrees to view in MyChart. Active MyChart status and patient understanding of how to access instructions and care plan via MyChart confirmed with patient.     Jeanie Cooks, PhD [facility], [facility] Social Worker Direct Dial: [phone]  Fax: [phone]

## 2023-11-03 ENCOUNTER — Other Ambulatory Visit: Payer: Self-pay | Admitting: Family Medicine

## 2023-12-16 ENCOUNTER — Other Ambulatory Visit: Payer: Self-pay | Admitting: Internal Medicine

## 2023-12-16 DIAGNOSIS — N411 Chronic prostatitis: Secondary | ICD-10-CM

## 2023-12-16 DIAGNOSIS — R319 Hematuria, unspecified: Secondary | ICD-10-CM

## 2024-01-13 ENCOUNTER — Other Ambulatory Visit: Payer: Self-pay | Admitting: Internal Medicine

## 2024-01-13 DIAGNOSIS — R319 Hematuria, unspecified: Secondary | ICD-10-CM

## 2024-01-13 DIAGNOSIS — N411 Chronic prostatitis: Secondary | ICD-10-CM

## 2024-01-19 ENCOUNTER — Ambulatory Visit: Payer: Self-pay | Admitting: Urology

## 2024-01-20 NOTE — Progress Notes (Signed)
    Assessment: 1.  Prostatitis, recurrent.  ESBL E. coli grew out both in July 2024 and in January 2025.  Felt to be due to anal intercourse.  Since his first visit here he has been using condoms and has had no recurrences.  2.  Screening for prostate cancer, normal DRE, normal PSA 2-1/2 years ago   Plan: 1.  Continue condom use with intercourse  2.  Office visit as needed   History of Present Illness:  2.17.2025: Initial visit for evaluation of recurrent prostatitis.  The patient denies prior urologic investigation/consultation.  He did have a UTI which was relatively uncomplicated 5 years ago.  Apparently treated with a short course of antibiotics.  He became ill in July 2024.  This necessitated hospitalization for management of urinary tract infection, considered prostatitis.  He grew ESBL E. coli.  Treated with IV antibiotics at home after hospitalization.  Approximately 1 month ago also developed lower urinary tract symptoms including hematuria.  He again grew ESBL E. coli.  Placed on Augmentin  as well as nitrofurantoin .  Currently, on nitrofurantoin  only, for about a month and a half.  Symptoms are better.  No dysuria, no gross hematuria, no fever, no urinary frequency.  The patient is homosexual.  He does participate in anal intercourse, usually without a condom.  He is monogamous with his husband, Krystal.  6.23.2025: Here today for recheck.  No symptoms of UTI since his last visit.  He has been using condoms with anal intercourse. Past Medical History:  Past Medical History:  Diagnosis Date   Hypertension    Lipoma of back 08/2014   ultrasound-confirmed    Past Surgical History:  Past Surgical History:  Procedure Laterality Date   LAPAROSCOPIC CHOLECYSTECTOMY  2005   TONSILECTOMY, ADENOIDECTOMY, BILATERAL MYRINGOTOMY AND TUBES      Allergies:  No Known Allergies  Family History:  Family History  Problem Relation Age of Onset   Breast cancer Mother        mets    Other Father        poor communication   Breast cancer Sister    Non-Hodgkin's lymphoma Brother    Colon cancer Neg Hx     Social History:  Social History   Tobacco Use   Smoking status: Never   Smokeless tobacco: Never  Substance Use Topics   Alcohol use: Yes    Comment: social   Drug use: No    Review of symptoms:  Constitutional:  Negative for unexplained weight loss, night sweats, fever, chills ENT:  Negative for nose bleeds, sinus pain, painful swallowing CV:  Negative for chest pain, shortness of breath, exercise intolerance, palpitations, loss of consciousness Resp:  Negative for cough, wheezing, shortness of breath GI:  Negative for nausea, vomiting, diarrhea, bloody stools GU:  Positives noted in HPI; otherwise negative for gross hematuria, dysuria, urinary incontinence Neuro:  Negative for seizures, poor balance, limb weakness, slurred speech Psych:  Negative for lack of energy, depression, anxiety Endocrine:  Negative for polydipsia, polyuria, symptoms of hypoglycemia (dizziness, hunger, sweating) Hematologic:  Negative for anemia, purpura, petechia, prolonged or excessive bleeding, use of anticoagulants  Allergic:  Negative for difficulty breathing or choking as a result of exposure to anything; no shellfish allergy; no allergic response (rash/itch) to materials, foods   Results:   I have reviewed prior GU notes  I have reviewed urinalysis--clear  I have reviewed PSA results--1.25 in September, 2022

## 2024-01-26 ENCOUNTER — Ambulatory Visit: Payer: Self-pay | Admitting: Urology

## 2024-01-26 VITALS — BP 131/87 | HR 94 | Ht 64.0 in | Wt 185.0 lb

## 2024-01-26 DIAGNOSIS — Z85528 Personal history of other malignant neoplasm of kidney: Secondary | ICD-10-CM

## 2024-01-26 DIAGNOSIS — N410 Acute prostatitis: Secondary | ICD-10-CM

## 2024-01-26 LAB — URINALYSIS, ROUTINE W REFLEX MICROSCOPIC
Bilirubin, UA: NEGATIVE
Glucose, UA: NEGATIVE
Leukocytes,UA: NEGATIVE
Nitrite, UA: NEGATIVE
Protein,UA: NEGATIVE
Specific Gravity, UA: 1.025 (ref 1.005–1.030)
Urobilinogen, Ur: 0.2 mg/dL (ref 0.2–1.0)
pH, UA: 5.5 (ref 5.0–7.5)

## 2024-01-26 LAB — MICROSCOPIC EXAMINATION

## 2024-01-29 ENCOUNTER — Other Ambulatory Visit: Payer: Self-pay | Admitting: Family Medicine

## 2024-02-15 ENCOUNTER — Other Ambulatory Visit: Payer: Self-pay | Admitting: Internal Medicine

## 2024-02-15 DIAGNOSIS — N411 Chronic prostatitis: Secondary | ICD-10-CM

## 2024-02-15 DIAGNOSIS — R319 Hematuria, unspecified: Secondary | ICD-10-CM

## 2024-03-12 ENCOUNTER — Other Ambulatory Visit: Payer: Self-pay | Admitting: Internal Medicine

## 2024-03-12 DIAGNOSIS — R319 Hematuria, unspecified: Secondary | ICD-10-CM

## 2024-03-12 DIAGNOSIS — N411 Chronic prostatitis: Secondary | ICD-10-CM

## 2024-04-07 ENCOUNTER — Other Ambulatory Visit: Payer: Self-pay | Admitting: Internal Medicine

## 2024-04-07 DIAGNOSIS — N411 Chronic prostatitis: Secondary | ICD-10-CM

## 2024-04-07 DIAGNOSIS — R319 Hematuria, unspecified: Secondary | ICD-10-CM

## 2024-05-05 ENCOUNTER — Other Ambulatory Visit: Payer: Self-pay | Admitting: Internal Medicine

## 2024-05-05 DIAGNOSIS — R319 Hematuria, unspecified: Secondary | ICD-10-CM

## 2024-05-05 DIAGNOSIS — N411 Chronic prostatitis: Secondary | ICD-10-CM

## 2024-06-03 ENCOUNTER — Other Ambulatory Visit: Payer: Self-pay | Admitting: Internal Medicine

## 2024-06-03 DIAGNOSIS — R319 Hematuria, unspecified: Secondary | ICD-10-CM

## 2024-06-03 DIAGNOSIS — N411 Chronic prostatitis: Secondary | ICD-10-CM

## 2024-06-30 ENCOUNTER — Other Ambulatory Visit: Payer: Self-pay | Admitting: Family Medicine

## 2024-06-30 DIAGNOSIS — R319 Hematuria, unspecified: Secondary | ICD-10-CM

## 2024-06-30 DIAGNOSIS — N411 Chronic prostatitis: Secondary | ICD-10-CM
